# Patient Record
Sex: Male | Born: 1935 | Race: White | Hispanic: No | Smoking: Never smoker
Health system: Southern US, Community
[De-identification: ages and names within clinical notes are randomized; demographics above are authoritative.]

## PROBLEM LIST (undated history)

## (undated) DIAGNOSIS — F0390 Unspecified dementia without behavioral disturbance: Secondary | ICD-10-CM

## (undated) DIAGNOSIS — D638 Anemia in other chronic diseases classified elsewhere: Secondary | ICD-10-CM

## (undated) DIAGNOSIS — M8580 Other specified disorders of bone density and structure, unspecified site: Secondary | ICD-10-CM

## (undated) DIAGNOSIS — I441 Atrioventricular block, second degree: Secondary | ICD-10-CM

## (undated) DIAGNOSIS — I10 Essential (primary) hypertension: Secondary | ICD-10-CM

## (undated) DIAGNOSIS — E119 Type 2 diabetes mellitus without complications: Secondary | ICD-10-CM

## (undated) HISTORY — DX: Atrioventricular block, second degree: I44.1

## (undated) HISTORY — DX: Other specified disorders of bone density and structure, unspecified site: M85.80

## (undated) HISTORY — DX: Unspecified dementia without behavioral disturbance: F03.90

## (undated) HISTORY — DX: Essential (primary) hypertension: I10

## (undated) HISTORY — PX: PARTIAL GASTRECTOMY: SHX2172

## (undated) HISTORY — PX: INGUINAL HERNIA REPAIR: SUR1180

## (undated) HISTORY — DX: Anemia in other chronic diseases classified elsewhere: D63.8

## (undated) HISTORY — DX: Type 2 diabetes mellitus without complications: E11.9

---

## 1978-01-23 HISTORY — PX: OTHER SURGICAL HISTORY: SHX169

## 2009-10-15 ENCOUNTER — Encounter: Admission: RE | Admit: 2009-10-15 | Discharge: 2009-10-15 | Payer: Self-pay | Admitting: Internal Medicine

## 2010-10-24 HISTORY — PX: TRANSTHORACIC ECHOCARDIOGRAM: SHX275

## 2011-05-03 DIAGNOSIS — R5381 Other malaise: Secondary | ICD-10-CM | POA: Diagnosis not present

## 2011-05-03 DIAGNOSIS — D649 Anemia, unspecified: Secondary | ICD-10-CM | POA: Diagnosis not present

## 2011-05-03 DIAGNOSIS — F0390 Unspecified dementia without behavioral disturbance: Secondary | ICD-10-CM | POA: Diagnosis not present

## 2011-05-10 DIAGNOSIS — R5381 Other malaise: Secondary | ICD-10-CM | POA: Diagnosis not present

## 2011-05-10 DIAGNOSIS — R5383 Other fatigue: Secondary | ICD-10-CM | POA: Diagnosis not present

## 2011-05-10 DIAGNOSIS — F0390 Unspecified dementia without behavioral disturbance: Secondary | ICD-10-CM | POA: Diagnosis not present

## 2011-05-10 DIAGNOSIS — K922 Gastrointestinal hemorrhage, unspecified: Secondary | ICD-10-CM | POA: Diagnosis not present

## 2011-05-10 DIAGNOSIS — D649 Anemia, unspecified: Secondary | ICD-10-CM | POA: Diagnosis not present

## 2011-10-20 DIAGNOSIS — Z23 Encounter for immunization: Secondary | ICD-10-CM | POA: Diagnosis not present

## 2011-11-07 DIAGNOSIS — R5383 Other fatigue: Secondary | ICD-10-CM | POA: Diagnosis not present

## 2011-11-07 DIAGNOSIS — R5381 Other malaise: Secondary | ICD-10-CM | POA: Diagnosis not present

## 2011-11-07 DIAGNOSIS — K922 Gastrointestinal hemorrhage, unspecified: Secondary | ICD-10-CM | POA: Diagnosis not present

## 2011-11-07 DIAGNOSIS — F0390 Unspecified dementia without behavioral disturbance: Secondary | ICD-10-CM | POA: Diagnosis not present

## 2011-11-07 DIAGNOSIS — D649 Anemia, unspecified: Secondary | ICD-10-CM | POA: Diagnosis not present

## 2011-11-07 DIAGNOSIS — I2510 Atherosclerotic heart disease of native coronary artery without angina pectoris: Secondary | ICD-10-CM | POA: Diagnosis not present

## 2011-11-07 DIAGNOSIS — Z0000 Encounter for general adult medical examination without abnormal findings: Secondary | ICD-10-CM | POA: Diagnosis not present

## 2011-11-14 DIAGNOSIS — Z0000 Encounter for general adult medical examination without abnormal findings: Secondary | ICD-10-CM | POA: Diagnosis not present

## 2011-11-14 DIAGNOSIS — M810 Age-related osteoporosis without current pathological fracture: Secondary | ICD-10-CM | POA: Diagnosis not present

## 2011-11-14 DIAGNOSIS — Z23 Encounter for immunization: Secondary | ICD-10-CM | POA: Diagnosis not present

## 2011-11-14 DIAGNOSIS — M159 Polyosteoarthritis, unspecified: Secondary | ICD-10-CM | POA: Diagnosis not present

## 2011-11-14 DIAGNOSIS — R5381 Other malaise: Secondary | ICD-10-CM | POA: Diagnosis not present

## 2011-11-14 DIAGNOSIS — D638 Anemia in other chronic diseases classified elsewhere: Secondary | ICD-10-CM | POA: Diagnosis not present

## 2011-11-14 DIAGNOSIS — F0390 Unspecified dementia without behavioral disturbance: Secondary | ICD-10-CM | POA: Diagnosis not present

## 2012-05-21 DIAGNOSIS — M159 Polyosteoarthritis, unspecified: Secondary | ICD-10-CM | POA: Diagnosis not present

## 2012-05-21 DIAGNOSIS — D638 Anemia in other chronic diseases classified elsewhere: Secondary | ICD-10-CM | POA: Diagnosis not present

## 2012-05-21 DIAGNOSIS — I2510 Atherosclerotic heart disease of native coronary artery without angina pectoris: Secondary | ICD-10-CM | POA: Diagnosis not present

## 2012-05-21 DIAGNOSIS — R5383 Other fatigue: Secondary | ICD-10-CM | POA: Diagnosis not present

## 2012-05-21 DIAGNOSIS — R5381 Other malaise: Secondary | ICD-10-CM | POA: Diagnosis not present

## 2012-05-28 DIAGNOSIS — R5381 Other malaise: Secondary | ICD-10-CM | POA: Diagnosis not present

## 2012-05-28 DIAGNOSIS — M159 Polyosteoarthritis, unspecified: Secondary | ICD-10-CM | POA: Diagnosis not present

## 2012-05-28 DIAGNOSIS — M545 Low back pain, unspecified: Secondary | ICD-10-CM | POA: Diagnosis not present

## 2012-05-28 DIAGNOSIS — R5383 Other fatigue: Secondary | ICD-10-CM | POA: Diagnosis not present

## 2012-05-28 DIAGNOSIS — F0390 Unspecified dementia without behavioral disturbance: Secondary | ICD-10-CM | POA: Diagnosis not present

## 2012-10-01 DIAGNOSIS — Z23 Encounter for immunization: Secondary | ICD-10-CM | POA: Diagnosis not present

## 2012-11-23 DIAGNOSIS — I441 Atrioventricular block, second degree: Secondary | ICD-10-CM

## 2012-11-23 HISTORY — DX: Atrioventricular block, second degree: I44.1

## 2012-11-26 DIAGNOSIS — R5381 Other malaise: Secondary | ICD-10-CM | POA: Diagnosis not present

## 2012-11-26 DIAGNOSIS — E119 Type 2 diabetes mellitus without complications: Secondary | ICD-10-CM | POA: Diagnosis not present

## 2012-11-26 DIAGNOSIS — F0390 Unspecified dementia without behavioral disturbance: Secondary | ICD-10-CM | POA: Diagnosis not present

## 2012-11-26 DIAGNOSIS — M545 Low back pain, unspecified: Secondary | ICD-10-CM | POA: Diagnosis not present

## 2012-11-26 DIAGNOSIS — D649 Anemia, unspecified: Secondary | ICD-10-CM | POA: Diagnosis not present

## 2012-11-26 DIAGNOSIS — Z1331 Encounter for screening for depression: Secondary | ICD-10-CM | POA: Diagnosis not present

## 2012-11-26 DIAGNOSIS — M899 Disorder of bone, unspecified: Secondary | ICD-10-CM | POA: Diagnosis not present

## 2012-11-26 DIAGNOSIS — Z0000 Encounter for general adult medical examination without abnormal findings: Secondary | ICD-10-CM | POA: Diagnosis not present

## 2012-11-27 DIAGNOSIS — M545 Low back pain, unspecified: Secondary | ICD-10-CM | POA: Diagnosis not present

## 2012-11-27 DIAGNOSIS — F0390 Unspecified dementia without behavioral disturbance: Secondary | ICD-10-CM | POA: Diagnosis not present

## 2012-11-27 DIAGNOSIS — R5381 Other malaise: Secondary | ICD-10-CM | POA: Diagnosis not present

## 2012-11-27 DIAGNOSIS — M159 Polyosteoarthritis, unspecified: Secondary | ICD-10-CM | POA: Diagnosis not present

## 2012-12-03 DIAGNOSIS — M159 Polyosteoarthritis, unspecified: Secondary | ICD-10-CM | POA: Diagnosis not present

## 2012-12-03 DIAGNOSIS — I441 Atrioventricular block, second degree: Secondary | ICD-10-CM | POA: Diagnosis not present

## 2012-12-03 DIAGNOSIS — D638 Anemia in other chronic diseases classified elsewhere: Secondary | ICD-10-CM | POA: Diagnosis not present

## 2012-12-03 DIAGNOSIS — E119 Type 2 diabetes mellitus without complications: Secondary | ICD-10-CM | POA: Diagnosis not present

## 2012-12-03 DIAGNOSIS — F0390 Unspecified dementia without behavioral disturbance: Secondary | ICD-10-CM | POA: Diagnosis not present

## 2012-12-29 ENCOUNTER — Encounter: Payer: Self-pay | Admitting: Cardiology

## 2012-12-31 ENCOUNTER — Encounter: Payer: Self-pay | Admitting: Cardiology

## 2012-12-31 ENCOUNTER — Ambulatory Visit (INDEPENDENT_AMBULATORY_CARE_PROVIDER_SITE_OTHER): Payer: Medicare Other | Admitting: Cardiology

## 2012-12-31 VITALS — BP 152/76 | HR 58 | Ht 69.0 in | Wt 160.8 lb

## 2012-12-31 DIAGNOSIS — I10 Essential (primary) hypertension: Secondary | ICD-10-CM | POA: Diagnosis not present

## 2012-12-31 DIAGNOSIS — E119 Type 2 diabetes mellitus without complications: Secondary | ICD-10-CM | POA: Diagnosis not present

## 2012-12-31 DIAGNOSIS — R011 Cardiac murmur, unspecified: Secondary | ICD-10-CM

## 2012-12-31 DIAGNOSIS — F0390 Unspecified dementia without behavioral disturbance: Secondary | ICD-10-CM

## 2012-12-31 DIAGNOSIS — I441 Atrioventricular block, second degree: Secondary | ICD-10-CM

## 2012-12-31 NOTE — Patient Instructions (Signed)
Can not drive until after completion of wearing the monitor -and results are back.  Dr Herbie Baltimore would like you to wear a monitor for 1 month.Your physician has recommended that you wear an event monitor. Event monitors are medical devices that record the heart's electrical activity. Doctors most often [place] these monitors to diagnose arrhythmias. Arrhythmias are problems with the speed or rhythm of the heartbeat. The monitor is a small, portable device. You can wear one while you do your normal daily activities. This is usually used to diagnose what is causing palpitations/syncope (passing out).  Your physician has requested that you have an echocardiogram. Echocardiography is a painless test that uses sound waves to create images of your heart. It provides your doctor with information about the size and shape of your heart and how well your heart's chambers and valves are working. This procedure takes approximately one hour. There are no restrictions for this procedure.  Your physician wants you to follow-up in 6 weeks Dr Herbie Baltimore to follow up results echo, monitor.  You will receive a reminder letter in the mail two months in advance. If you don't receive a letter, please call our office to schedule the follow-up appointment.

## 2012-12-31 NOTE — Progress Notes (Signed)
PATIENT: Christian Daniels MRN: [id number] DOB: 1916/10/09 PCP: Georgianne Fick, MD  Clinic Note: Chief Complaint  Patient presents with  . New Evaluation    abn ekg -missed beats,no chest pain , no sob, no edema-in the past patient wore a monitor   HPI: Christian Daniels is a 79 y.o. male with a PMH below who presents today for before every meal won't on ECG however I do not have that ECG to review.  Interval History:  Marico is a very pleasant elderly gentleman who is the husband of one of my patients, Christian Daniels. He was recently diagnosed with diabetes to go along with his chronic anemia osteoarthritis and senile dementia. As part of routine evaluation he was found to have what looked to be Mobitz type II second-degree AV block on ECG. He does note having some mild palpitations, but denies any irregular sensation in his heart her chest. No lightheadedness or dizziness associated with anything more than some positional dizziness. He denies any syncope or near-syncope. TIA or amaurosis fugax symptoms. At no time has he noted any sensation of any chest tightness or pressure with rest or exertion he is mobile in a mandatory. Walking and actually driving still. He is mostly functional, but has forgetfulness. His wife essentially keeps him on the right track.  In the past, he had a fall and wore a monitor for while was told he had PVCs He was evaluated with echocardiogram back in October 2012 which showed essentially normal LV function borderline mitral valve prolapse with mild regurgitation. Otherwise essentially mild irregularities. He denies any melena, hematochezia or hematuria. No claudication symptoms.  Past Medical History  Diagnosis Date  . Diabetes mellitus type 2, controlled, without complications 01/02/2013  . Essential hypertension 01/02/2013  . Senile dementia 01/02/2013  . Second degree AV block, Mobitz type II  November 2014    Seen on office echo  . Anemia of chronic disease   .  Osteopenia     Prior Cardiac Evaluation and Past Surgical History: Past Surgical History  Procedure Laterality Date  . Partial prostatectomy  1980  . Partial gastrectomy   1950s  . Inguinal hernia repair  1960 and 65    X2  . Transthoracic echocardiogram  October 2012    Proximal septal thickening. EF greater than 55%. No regional wall motion abnormalities. Mild LA dilation, mild-moderate RA dilation. Mild MAC with mild posterior leaflet prolapse and mild to moderate MR.    No Known Allergies  Current Outpatient Prescriptions  Medication Sig Dispense Refill  . Calcium Carb-Cholecalciferol (CALCIUM 600 + D PO) Take by mouth daily.      . Cholecalciferol (D3-1000 PO) Take by mouth.      . donepezil (ARICEPT) 10 MG tablet Take 10 mg by mouth at bedtime.      . Folic Acid-Vit B6-Vit B12 (B COMPLEX-FOLIC ACID PO) Take by mouth daily.      . Multiple Vitamins-Minerals (MULTIVITAMIN WITH MINERALS) tablet Take 1 tablet by mouth daily.      . vitamin C (ASCORBIC ACID) 500 MG tablet Take 500 mg by mouth daily.      . vitamin E (VITAMIN E) 400 UNIT capsule Take 400 Units by mouth daily.       No current facility-administered medications for this visit.    History   Social History Narrative   He is a married father of 3, grandfather 4. He has been married to his wife Christian Daniels for 61 years. He lives with his  wife. He does help around the house.   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   He is a nonsmoker who does not drink alcohol.   He is quite active and does a moderate amount of walking.   Family History: family history includes Asthma in his son; Cancer (age of onset: 70) in his son; Cancer (age of onset: 33) in his mother; Dementia in his sister; Diabetes in his father; Hyperlipidemia in his brother; Stroke (age of onset: 11) in his father.  ROS: A comprehensive Review of Systems - Negative except Forgetfulness, otherwise negative  PHYSICAL EXAM BP  152/76  Pulse 58  Ht 5\' 9"  (1.753 m)  Wt 160 lb 12.8 oz (72.938 kg)  BMI 23.74 kg/m2 General appearance: alert and oriented to person place only, cooperative, appears stated age, no distress and well-nourished and well-groomed. Very well and/not forthcoming with data when asked questions. HEENT: [place]/AT, EOMI, MMM, anicteric sclera Neck: no adenopathy, no carotid bruit, no JVD, supple, symmetrical, trachea midline and thyroid not enlarged, symmetric, no tenderness/mass/nodules Lungs: clear to auscultation bilaterally and normal percussion bilaterally Heart: normal apical impulse, regular rate and rhythm, S1, S2 normal, no S3 or S4 and 1-2/6 HSM at apex with maybe 1/6 SEM at the base. Interestingly he had episodes where his heart rate would Speed up and slow down intermittently during the exam. Abdomen: soft, non-tender; bowel sounds normal; no masses,  no organomegaly Extremities: extremities normal, atraumatic, no cyanosis or edema and no ulcers, gangrene or trophic changes Pulses: 2+ and symmetric Neurologic: Mental status: Alert, oriented, thought content appropriate, alertness: alert, orientation: person, place, city, affect: normal Cranial nerves: Grossly normal  AVW:[id number] today: Yes Rate: 58 , Rhythm: Sinus bradycardia with first-degree AV block  Recent Labs: Not available  ASSESSMENT / PLAN: Very pleasant elderly gentleman with mild dementia who was found to have possible Mobitz type II heart block on ECG. He is not as yet noticed any symptoms.  Second degree atrioventricular block Unfortunately, I do not have the ECGs to review. No reason to doubt the veracity of the finding. This is somewhat concerning as complete heart block as a next step. He does have prolonged PR interval but did not see any prolongation of the PR interval. He is not on any AV nodal agents. I will need to evaluate his rhythm over the course of time) term whether or not he want consideration for pacemaker. I  will have him wear a CardioNet event monitor. I did talk to his wife and him about this concerning finding and there is significant risk for possible syncope associated with it. With that in mind I had to instruct him that I am not able to authorize him to drive his car would this diagnosis until it has been confirmed or denied.  Essential hypertension His blood pressure is borderline elevated, however with his 82 and concern for possible bradycardia related near-syncope, my information will be to allow for mild permissive hypertension the range of 130 to 150 mmHg systolic. I would not make adjustments to his regimen.  Murmur I do not think the murmur that hearing is of any significance, however there could be and I believe is what may spark the more aggressive evaluation of the arrhythmia. I also like to evaluate the extent of his prolapse and mitral regurgitation seen on previous echo. There was also the systolic murmur that would suggest aortic sclerosis. Plan: 2-D echocardiogram.    Orders Placed This  Encounter  Procedures  . EKG 12-Lead  . Cardiac event monitor    Second degree  AV block    Standing Status: Future     Number of Occurrences: 1     Standing Expiration Date: 12/31/2013    Order Specific Question:  Where should this test be performed    Answer:  [facility]  . 2D Echocardiogram without contrast    Standing Status: Future     Number of Occurrences:      Standing Expiration Date: 12/31/2013    Order Specific Question:  Type of Echo    Answer:  Complete    Order Specific Question:  Where should this test be performed    Answer:  [facility]    Order Specific Question:  Reason for exam-Echo    Answer:  Murmur  785.2    Followup: 6 weeks to discuss the results of the monitor and echocardiogram.    Madilyne Tadlock W. Herbie Baltimore, M.D., M.S. [facility] [address] [place], [place]  [address]  [phone] Pager #  [phone]

## 2013-01-02 ENCOUNTER — Encounter: Payer: Self-pay | Admitting: Cardiology

## 2013-01-02 DIAGNOSIS — I10 Essential (primary) hypertension: Secondary | ICD-10-CM

## 2013-01-02 DIAGNOSIS — R011 Cardiac murmur, unspecified: Secondary | ICD-10-CM | POA: Insufficient documentation

## 2013-01-02 DIAGNOSIS — F0390 Unspecified dementia without behavioral disturbance: Secondary | ICD-10-CM

## 2013-01-02 DIAGNOSIS — E119 Type 2 diabetes mellitus without complications: Secondary | ICD-10-CM

## 2013-01-02 DIAGNOSIS — I441 Atrioventricular block, second degree: Secondary | ICD-10-CM | POA: Insufficient documentation

## 2013-01-02 HISTORY — DX: Unspecified dementia, unspecified severity, without behavioral disturbance, psychotic disturbance, mood disturbance, and anxiety: F03.90

## 2013-01-02 HISTORY — DX: Type 2 diabetes mellitus without complications: E11.9

## 2013-01-02 HISTORY — DX: Essential (primary) hypertension: I10

## 2013-01-02 NOTE — Assessment & Plan Note (Addendum)
I do not think the murmur that hearing is of any significance, however there could be and I believe is what may spark the more aggressive evaluation of the arrhythmia. I also like to evaluate the extent of his prolapse and mitral regurgitation seen on previous echo. There was also the systolic murmur that would suggest aortic sclerosis. Plan: 2-D echocardiogram.

## 2013-01-02 NOTE — Assessment & Plan Note (Signed)
His blood pressure is borderline elevated, however with his 82 and concern for possible bradycardia related near-syncope, my information will be to allow for mild permissive hypertension the range of 130 to 150 mmHg systolic. I would not make adjustments to his regimen.

## 2013-01-02 NOTE — Assessment & Plan Note (Addendum)
Unfortunately, I do not have the ECGs to review. No reason to doubt the veracity of the finding. This is somewhat concerning as complete heart block as a next step. Christian Daniels does have prolonged PR interval but did not see any prolongation of the PR interval. Christian Daniels is not on any AV nodal agents. I will need to evaluate his rhythm over the course of time) term whether or not Christian Daniels want consideration for pacemaker. I will have him wear a CardioNet event monitor. I did talk to his wife and him about this concerning finding and there is significant risk for possible syncope associated with it. With that in mind I had to instruct him that I am not able to authorize him to drive his car would this diagnosis until it has been confirmed or denied.

## 2013-01-21 ENCOUNTER — Ambulatory Visit (HOSPITAL_COMMUNITY)
Admission: RE | Admit: 2013-01-21 | Discharge: 2013-01-21 | Disposition: A | Discharge status: Home / Self Care | Payer: Medicare Other | Source: Ambulatory Visit | Attending: Cardiovascular Disease | Admitting: Cardiovascular Disease

## 2013-01-21 DIAGNOSIS — I059 Rheumatic mitral valve disease, unspecified: Secondary | ICD-10-CM | POA: Diagnosis not present

## 2013-01-21 DIAGNOSIS — I10 Essential (primary) hypertension: Secondary | ICD-10-CM | POA: Diagnosis not present

## 2013-01-21 DIAGNOSIS — R011 Cardiac murmur, unspecified: Secondary | ICD-10-CM | POA: Diagnosis not present

## 2013-01-21 DIAGNOSIS — I441 Atrioventricular block, second degree: Secondary | ICD-10-CM | POA: Diagnosis not present

## 2013-01-21 DIAGNOSIS — E119 Type 2 diabetes mellitus without complications: Secondary | ICD-10-CM | POA: Diagnosis not present

## 2013-01-21 DIAGNOSIS — I079 Rheumatic tricuspid valve disease, unspecified: Secondary | ICD-10-CM | POA: Diagnosis not present

## 2013-01-21 HISTORY — PX: TRANSTHORACIC ECHOCARDIOGRAM: SHX275

## 2013-01-21 NOTE — Progress Notes (Signed)
2D Echo Performed 01/21/2013    Christian Daniels, RCS  

## 2013-02-03 ENCOUNTER — Ambulatory Visit (INDEPENDENT_AMBULATORY_CARE_PROVIDER_SITE_OTHER): Payer: Medicare Other | Admitting: Cardiology

## 2013-02-03 ENCOUNTER — Encounter: Payer: Self-pay | Admitting: Cardiology

## 2013-02-03 VITALS — BP 110/70 | HR 60 | Ht 67.5 in | Wt 156.9 lb

## 2013-02-03 DIAGNOSIS — I10 Essential (primary) hypertension: Secondary | ICD-10-CM

## 2013-02-03 DIAGNOSIS — I441 Atrioventricular block, second degree: Secondary | ICD-10-CM

## 2013-02-03 DIAGNOSIS — I471 Supraventricular tachycardia, unspecified: Secondary | ICD-10-CM

## 2013-02-03 DIAGNOSIS — F0390 Unspecified dementia without behavioral disturbance: Secondary | ICD-10-CM | POA: Diagnosis not present

## 2013-02-03 DIAGNOSIS — R011 Cardiac murmur, unspecified: Secondary | ICD-10-CM

## 2013-02-03 NOTE — Progress Notes (Signed)
PATIENT: Christian Daniels MRN: [id number] DOB: Mar 25, 1916 PCP: Georgianne Fick, MD  Clinic Note: Chief Complaint  Patient presents with  . Follow-up    RESULTS ECHO AND MONITOR, NO CHEST PAIN ,NO SOB, MO EDEMA   HPI: Christian Daniels is a 80 y.o. male with a PMH below who presents today for followup after having an echocardiogram and an event monitor. This is done to evaluate secondary AV block on baseline ECG.  An echocardiogram that was essentially normal with mild MR and mild TR but normal EF. No wall motion abnormalities or valvular lesions.  He also had an event monitor that showed intermittent sinus bradycardia with first-degree AV block. PVCs, blocked PACs or PVCs that would lead to pauses as well as simple pauses of 2-3 seconds. He then had multiple beats of PSVT or PAT the 110s to 120s and 30s. An episode of it apparently conducted SVT or a true VT episode. The comprehensive review this monitor will take quite a long time.  Significantly, after our talk of possibility of needing pacemaker during last visit, he had a long talk together about his desires, it is quite clear that he would not want a pacemaker, regardless of the findings. He simply just wants to continue on with his life as is with no significant events or procedures.) Have further concept, and again there agreed that he was not going to get his driver's license renewed, and we'll no longer drive. Lastly, the technologist the presence of bleeding or ulcer both of them, and his desire for natural death which after discussion with sounds consistent with a DO NOT RESUSCITATE decision as far as CPR or intubation.  Interval History:  [name] is a very pleasant elderly gentleman who is the husband of one of my patients, Christian Daniels. He was recently diagnosed with diabetes to go along with his chronic anemia osteoarthritis and senile dementia. As part of routine evaluation he was found to have what looked to be Mobitz type II second-degree AV  block on ECG. He only notes mild intermittent  palpitations, but denies any irregular sensation in his heart her chest. No lightheadedness or dizziness associated with anything more than some positional dizziness. He denies any syncope or near-syncope. TIA or amaurosis fugax symptoms. Actually when you ask him questions, most the time his wife will ask the question, but he basically just smiles and says he feels fine.    At no time has he noted any sensation of any chest tightness or pressure with rest or exertion he is mobile in a mandatory. Walking and actually driving still. He is mostly functional, but has forgetfulness. His wife essentially keeps him on the right track.  In the past, he had a fall and wore a monitor for while was told he had PVCs He was evaluated with echocardiogram back in October 2012 which showed essentially normal LV function borderline mitral valve prolapse with mild regurgitation. Otherwise essentially mild irregularities. He denies any melena, hematochezia or hematuria. No claudication symptoms.  Past Medical History  Diagnosis Date  . Diabetes mellitus type 2, controlled, without complications 01/02/2013  . Essential hypertension 01/02/2013  . Senile dementia 01/02/2013  . Second degree AV block, Mobitz type II  November 2014    Seen on office ECG; -- monitor shows intermittent second-degree block which appears to potentially be type II in some places but in other places appear to be wenkebach. Bradycardia with first-degree block and PVCs. Occasionally PVCs or block. Multiple runs of PSVT  and one episode of VT. All symptoms were asymptomatic  . Anemia of chronic disease   . Osteopenia     Prior Cardiac Evaluation and Past Surgical History: Past Surgical History  Procedure Laterality Date  . Partial prostatectomy  1980  . Partial gastrectomy   1950s  . Inguinal hernia repair  1960 and 60    X2  . Transthoracic echocardiogram  October 2012    Proximal septal  thickening. EF greater than 55%. No regional wall motion abnormalities. Mild LA dilation, mild-moderate RA dilation. Mild MAC with mild posterior leaflet prolapse and mild to moderate MR.  . Transthoracic echocardiogram  01/21/2013    Normal EF with hyperdynamic contraction EF 65-70 %. Mild MR and mild TR. No significant wall motion abnormalities or valvular lesions.    No Known Allergies  Current Outpatient Prescriptions  Medication Sig Dispense Refill  . Calcium Carb-Cholecalciferol (CALCIUM 600 + D PO) Take by mouth daily.      . Cholecalciferol (D3-1000 PO) Take by mouth.      . donepezil (ARICEPT) 10 MG tablet Take 10 mg by mouth at bedtime.      . Folic Acid-Vit N2-TFT D32 (B COMPLEX-FOLIC ACID PO) Take by mouth daily.      . Multiple Vitamins-Minerals (MULTIVITAMIN WITH MINERALS) tablet Take 1 tablet by mouth daily.      . vitamin C (ASCORBIC ACID) 500 MG tablet Take 500 mg by mouth daily as needed.       . vitamin E (VITAMIN E) 400 UNIT capsule Take 400 Units by mouth daily.       No current facility-administered medications for this visit.    History   Social History Narrative   He is a married father of 50, grandfather 46. He has been married to his wife Christian Daniels for 61 years. He lives with his wife. He does help around the house.   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   He is a nonsmoker who does not drink alcohol.   He is quite active and does a moderate amount of walking.   The patient has a living will and desire for natural death.  Discussed in detail the concept of DO NOT RESUSCITATE. Patient and wife both agree that this is a good idea and will discuss with PCP.   Family History: family history includes Asthma in his son; Cancer (age of onset: 31) in his son; Cancer (age of onset: 32) in his mother; Dementia in his sister; Diabetes in his father; Hyperlipidemia in his brother; Stroke (age of onset: 97) in his father.  ROS: A  comprehensive Review of Systems - Negative except Forgetfulness, otherwise negative  PHYSICAL EXAM BP 110/70  Pulse 60  Ht 5' 7.5" (1.715 m)  Wt 156 lb 14.4 oz (71.169 kg)  BMI 24.20 kg/m2 General appearance: alert and oriented to person place only, cooperative, appears stated age, no distress and well-nourished and well-groomed. Very well and/not forthcoming with data when asked questions. HEENT: [place]/AT, EOMI, MMM, anicteric sclera Neck: no adenopathy, no carotid bruit, no JVD, supple, symmetrical, trachea midline and thyroid not enlarged, symmetric, no tenderness/mass/nodules Lungs: clear to auscultation bilaterally and normal percussion bilaterally Heart: normal apical impulse, regular rate and rhythm, S1, S2 normal, no S3 or S4 and 1-2/6 HSM at apex with maybe 1/6 SEM at the base. Interestingly he had episodes where his heart rate would Speed up and slow down intermittently during the exam. Abdomen: soft, non-tender; bowel  sounds normal; no masses,  no organomegaly Extremities: extremities normal, atraumatic, no cyanosis or edema and no ulcers, gangrene or trophic changes Pulses: 2+ and symmetric Neurologic: Mental status: Alert, oriented, thought content appropriate, alertness: alert, orientation: person, place, city, affect: normal Cranial nerves: Grossly normal  PPJ:[id number] today: Yes Rate: 58 , Rhythm: Sinus bradycardia with first-degree AV block  Recent Labs: Not available  ASSESSMENT / PLAN: Very pleasant elderly gentleman with mild dementia who was found to have possible Mobitz type II heart block on ECG. He is not as yet noticed any symptoms. I did have a long talk with the patient and his wife about the decision to not proceed with pacemaker and potential symptoms that could occur. They're concerned about possible syncope while driving and therefore not to drive. Will not have his driver's license renewed.  Second degree atrioventricular block There does indeed appear to be  some Mobitz Type II with some of the dropped beats on the monitor strips, but unfortunately the strips did not allow enough complexes to the PR interval.  Also noted was frequent PSVT, one run of VT. Multiple pauses that could potentially be blocked PACs, versus Second-Degree AV Block.  Overall, he is not overly symptomatic, and therefore with minimal block episodes with no pauses greater than 3 seconds, and not sure he would meet criteria for pacemaker regardless of whether he would desire 1. After long discussion they have decided that he would not want to go forward with the pacemaker, especially with no symptoms, but really just wants to go about his life with no further evaluation.  PSVT (paroxysmal supraventricular tachycardia) - short bursts that are asymptomatic Interesting that he has short bursts of tachycardia arrhythmias in the setting of sinus bradycardia with first-degree AV block and second-degree block. Some impression of tachybradycardia syndrome, although that is usually with atrial fibrillation. Again he is not interested in pacemaker, and with the bradycardia spells with pauses, I would not use beta blocker or other AV nodal agent to treat PSVT. As he is not symptomatic, I think the best plan would be to monitor. I will have and agree with the decision to not continue driving as I am fearful that one of these episodes could lead to syncope.   Essential hypertension Well-controlled today. Would not adjust medications.  Senile dementia He does seem to be relatively poor historian, and I am in agreement with him likely having dementia. With all this in consideration, the decision not to go forward with pacemaker placement is probably a reasonable decision to make. They seem to be relatively comfortable with the symptoms and potential outcomes of either bradycardia or tachyarrhythmia. Repeatedly his regimen the comment that "he just wants to go home and live his life regardless of what  happens ".  Murmur Mild murmur with no significant lesions noted on echo.    Orders Placed This Encounter  Procedures  . Monitor    This order was created through External Result Entry    Followup: He'll return to the care of his primary provider. Based on discussion with the patient and his wife, the decision is to not proceed any further treatment of his arrhythmias. They have decided to pursue DO NOT RESUSCITATE order after a detailed discussion. Greater than 45 minutes was spent and calcium along as well as explaining the results of the tests.   Kele Barthelemy W. Ellyn Hack, M.D., M.S. [facility] [address] [place], [place]  [address]  [phone] Pager #  [phone]  

## 2013-02-03 NOTE — Patient Instructions (Signed)
AGREE PATIENT SHOULD NOT DRIVE.  Have a discussion with your primary about DNR.  Your physician recommends that you schedule a follow-up appointment AS NEEDED. You will follow up with your primary.

## 2013-02-05 ENCOUNTER — Encounter: Payer: Self-pay | Admitting: Cardiology

## 2013-02-05 DIAGNOSIS — I471 Supraventricular tachycardia: Secondary | ICD-10-CM | POA: Insufficient documentation

## 2013-02-05 NOTE — Assessment & Plan Note (Signed)
There does indeed appear to be some Mobitz Type II with some of the dropped beats on the monitor strips, but unfortunately the strips did not allow enough complexes to the PR interval.  Also noted was frequent PSVT, one run of VT. Multiple pauses that could potentially be blocked PACs, versus Second-Degree AV Block.  Overall, he is not overly symptomatic, and therefore with minimal block episodes with no pauses greater than 3 seconds, and not sure he would meet criteria for pacemaker regardless of whether he would desire 1. After long discussion they have decided that he would not want to go forward with the pacemaker, especially with no symptoms, but really just wants to go about his life with no further evaluation.

## 2013-02-05 NOTE — Assessment & Plan Note (Signed)
Interesting that he has short bursts of tachycardia arrhythmias in the setting of sinus bradycardia with first-degree AV block and second-degree block. Some impression of tachybradycardia syndrome, although that is usually with atrial fibrillation. Again he is not interested in pacemaker, and with the bradycardia spells with pauses, I would not use beta blocker or other AV nodal agent to treat PSVT. As he is not symptomatic, I think the best plan would be to monitor. I will have and agree with the decision to not continue driving as I am fearful that one of these episodes could lead to syncope.

## 2013-02-05 NOTE — Assessment & Plan Note (Signed)
He does seem to be relatively poor historian, and I am in agreement with him likely having dementia. With all this in consideration, the decision not to go forward with pacemaker placement is probably a reasonable decision to make. They seem to be relatively comfortable with the symptoms and potential outcomes of either bradycardia or tachyarrhythmia. Repeatedly his regimen the comment that "he just wants to go home and live his life regardless of what happens ".

## 2013-02-05 NOTE — Assessment & Plan Note (Signed)
Mild murmur with no significant lesions noted on echo.

## 2013-02-05 NOTE — Assessment & Plan Note (Signed)
Well-controlled today. Would not adjust medications.

## 2013-02-18 ENCOUNTER — Telehealth: Payer: Self-pay | Admitting: Cardiology

## 2013-02-18 NOTE — Telephone Encounter (Signed)
He should be fine with short driving on back roads. Leonie Man, MD

## 2013-02-18 NOTE — Telephone Encounter (Signed)
Returned call and informed wife per instructions by MD.  Verbalized understanding and agreed w/ plan.  Stated pt is not having any problems and is doing well.  Wife thanked Dr. Ellyn Hack "from the bottom of my heart."

## 2013-02-18 NOTE — Telephone Encounter (Signed)
Returned call.  No answer/voicemail.  Will try later.  IF PT CALLS BACK, PLEASE FIND OUT WHAT HE NEEDS PERMISSION FOR SO THIS CAN BE ADDRESSED BEFORE CALL IS RETURNED.

## 2013-02-18 NOTE — Telephone Encounter (Signed)
Has a question? Would like to get  Dr.Harding permission on something .Marland Kitchen Please Call   Thanks

## 2013-02-18 NOTE — Telephone Encounter (Signed)
Returned call and pt verified x 2 w/ Dyann Ruddle, pt's wife.  Stated they wanted to know if pt can drive to [employer] or [place], which are about 10 mins from home.  Stated pt is bothered more having to wait for someone to take him than if he drives himself.  Stated they travel when the streets are quiet and clear and wanted to know if pt can do this.  Informed Dr. Ellyn Hack will be notified.  Message forwarded to Dr. Sharlyn Bologna, RN.

## 2013-03-01 NOTE — Progress Notes (Signed)
Quick Note:  Final Report:   Avg HR 65bpm; Peak HR 153 bpm; Low HR 31 bpm  Mostly sinus rhythm with a first-degree AV block. Occasional sinus bradycardia with first-degree AV block. Associated with PACs, PVCs (occasional blocked PACs) Occasional wenkebach block. With the longest pause being 2.8 seconds Frequent PACs/PVCs with frequent atrial runs/PSVT (rate as high as 153 beats per minute) no longer than 34 seconds. PVCs and couplets, with 1 episode of nonsustained VT of roughly 10-18 beats.  All asymptomatic.  Given that no symptoms, would not recommend beta blocker or any a middle aged for brief runs of PSVT, unless prolonged and symptomatic. The presence of first degree AV block with intermittent wenkebach block and pauses would be risky. We discussed driving limitations and expectant management during clinic visit.  Leonie Man, MD  ______

## 2013-04-08 DIAGNOSIS — E119 Type 2 diabetes mellitus without complications: Secondary | ICD-10-CM | POA: Diagnosis not present

## 2013-04-15 DIAGNOSIS — M159 Polyosteoarthritis, unspecified: Secondary | ICD-10-CM | POA: Diagnosis not present

## 2013-04-15 DIAGNOSIS — I441 Atrioventricular block, second degree: Secondary | ICD-10-CM | POA: Diagnosis not present

## 2013-04-15 DIAGNOSIS — E119 Type 2 diabetes mellitus without complications: Secondary | ICD-10-CM | POA: Diagnosis not present

## 2013-04-15 DIAGNOSIS — F0390 Unspecified dementia without behavioral disturbance: Secondary | ICD-10-CM | POA: Diagnosis not present

## 2013-08-26 DIAGNOSIS — L821 Other seborrheic keratosis: Secondary | ICD-10-CM | POA: Diagnosis not present

## 2013-08-26 DIAGNOSIS — D2330 Other benign neoplasm of skin of unspecified part of face: Secondary | ICD-10-CM | POA: Diagnosis not present

## 2013-08-26 DIAGNOSIS — D485 Neoplasm of uncertain behavior of skin: Secondary | ICD-10-CM | POA: Diagnosis not present

## 2013-08-26 DIAGNOSIS — C44319 Basal cell carcinoma of skin of other parts of face: Secondary | ICD-10-CM | POA: Diagnosis not present

## 2013-08-26 DIAGNOSIS — D1801 Hemangioma of skin and subcutaneous tissue: Secondary | ICD-10-CM | POA: Diagnosis not present

## 2013-09-17 DIAGNOSIS — C44319 Basal cell carcinoma of skin of other parts of face: Secondary | ICD-10-CM | POA: Diagnosis not present

## 2013-09-23 DIAGNOSIS — Z23 Encounter for immunization: Secondary | ICD-10-CM | POA: Diagnosis not present

## 2013-10-28 DIAGNOSIS — E119 Type 2 diabetes mellitus without complications: Secondary | ICD-10-CM | POA: Diagnosis not present

## 2013-11-04 DIAGNOSIS — M159 Polyosteoarthritis, unspecified: Secondary | ICD-10-CM | POA: Diagnosis not present

## 2013-11-04 DIAGNOSIS — I441 Atrioventricular block, second degree: Secondary | ICD-10-CM | POA: Diagnosis not present

## 2013-11-04 DIAGNOSIS — E119 Type 2 diabetes mellitus without complications: Secondary | ICD-10-CM | POA: Diagnosis not present

## 2013-11-04 DIAGNOSIS — F0390 Unspecified dementia without behavioral disturbance: Secondary | ICD-10-CM | POA: Diagnosis not present

## 2014-04-23 DIAGNOSIS — M159 Polyosteoarthritis, unspecified: Secondary | ICD-10-CM | POA: Diagnosis not present

## 2014-04-23 DIAGNOSIS — F0390 Unspecified dementia without behavioral disturbance: Secondary | ICD-10-CM | POA: Diagnosis not present

## 2014-04-23 DIAGNOSIS — E119 Type 2 diabetes mellitus without complications: Secondary | ICD-10-CM | POA: Diagnosis not present

## 2014-04-23 DIAGNOSIS — Z0000 Encounter for general adult medical examination without abnormal findings: Secondary | ICD-10-CM | POA: Diagnosis not present

## 2014-04-23 DIAGNOSIS — I441 Atrioventricular block, second degree: Secondary | ICD-10-CM | POA: Diagnosis not present

## 2014-04-23 DIAGNOSIS — Z1389 Encounter for screening for other disorder: Secondary | ICD-10-CM | POA: Diagnosis not present

## 2014-04-23 DIAGNOSIS — N390 Urinary tract infection, site not specified: Secondary | ICD-10-CM | POA: Diagnosis not present

## 2014-04-30 DIAGNOSIS — E119 Type 2 diabetes mellitus without complications: Secondary | ICD-10-CM | POA: Diagnosis not present

## 2014-04-30 DIAGNOSIS — M545 Low back pain: Secondary | ICD-10-CM | POA: Diagnosis not present

## 2014-04-30 DIAGNOSIS — M159 Polyosteoarthritis, unspecified: Secondary | ICD-10-CM | POA: Diagnosis not present

## 2014-04-30 DIAGNOSIS — F0390 Unspecified dementia without behavioral disturbance: Secondary | ICD-10-CM | POA: Diagnosis not present

## 2014-10-29 DIAGNOSIS — E119 Type 2 diabetes mellitus without complications: Secondary | ICD-10-CM | POA: Diagnosis not present

## 2014-11-05 DIAGNOSIS — M159 Polyosteoarthritis, unspecified: Secondary | ICD-10-CM | POA: Diagnosis not present

## 2014-11-05 DIAGNOSIS — E119 Type 2 diabetes mellitus without complications: Secondary | ICD-10-CM | POA: Diagnosis not present

## 2014-11-05 DIAGNOSIS — I441 Atrioventricular block, second degree: Secondary | ICD-10-CM | POA: Diagnosis not present

## 2014-11-05 DIAGNOSIS — Z23 Encounter for immunization: Secondary | ICD-10-CM | POA: Diagnosis not present

## 2014-11-05 DIAGNOSIS — F0390 Unspecified dementia without behavioral disturbance: Secondary | ICD-10-CM | POA: Diagnosis not present

## 2015-05-13 DIAGNOSIS — F0390 Unspecified dementia without behavioral disturbance: Secondary | ICD-10-CM | POA: Diagnosis not present

## 2015-05-13 DIAGNOSIS — M159 Polyosteoarthritis, unspecified: Secondary | ICD-10-CM | POA: Diagnosis not present

## 2015-05-13 DIAGNOSIS — N390 Urinary tract infection, site not specified: Secondary | ICD-10-CM | POA: Diagnosis not present

## 2015-05-13 DIAGNOSIS — Z0000 Encounter for general adult medical examination without abnormal findings: Secondary | ICD-10-CM | POA: Diagnosis not present

## 2015-05-13 DIAGNOSIS — I441 Atrioventricular block, second degree: Secondary | ICD-10-CM | POA: Diagnosis not present

## 2015-05-13 DIAGNOSIS — E119 Type 2 diabetes mellitus without complications: Secondary | ICD-10-CM | POA: Diagnosis not present

## 2015-06-03 DIAGNOSIS — I441 Atrioventricular block, second degree: Secondary | ICD-10-CM | POA: Diagnosis not present

## 2015-06-03 DIAGNOSIS — E119 Type 2 diabetes mellitus without complications: Secondary | ICD-10-CM | POA: Diagnosis not present

## 2015-06-03 DIAGNOSIS — F0390 Unspecified dementia without behavioral disturbance: Secondary | ICD-10-CM | POA: Diagnosis not present

## 2015-06-03 DIAGNOSIS — M159 Polyosteoarthritis, unspecified: Secondary | ICD-10-CM | POA: Diagnosis not present

## 2015-07-15 ENCOUNTER — Ambulatory Visit: Payer: Medicare Other | Admitting: Podiatry

## 2015-07-22 ENCOUNTER — Encounter: Payer: Self-pay | Admitting: Podiatry

## 2015-07-22 ENCOUNTER — Ambulatory Visit (INDEPENDENT_AMBULATORY_CARE_PROVIDER_SITE_OTHER): Payer: Medicare Other | Admitting: Podiatry

## 2015-07-22 VITALS — BP 185/92 | HR 59 | Resp 14

## 2015-07-22 DIAGNOSIS — B351 Tinea unguium: Secondary | ICD-10-CM | POA: Diagnosis not present

## 2015-07-22 DIAGNOSIS — M79676 Pain in unspecified toe(s): Secondary | ICD-10-CM | POA: Diagnosis not present

## 2015-07-22 NOTE — Progress Notes (Signed)
   Subjective:    Patient ID: ORMOND SCHERER, male    DOB: 08-12-1916, 82 y.o.   MRN: [id number]  HPI this patient presents to the office with chief complaint of long thick painful nails on both feet. He states the nails have become painful and tender walking and wearing his shoes. He is unable to self treat. This patient has Alzheimer's. This patient presents the office for preventative foot care services. This patient is diabetic.      Review of Systems  All other systems reviewed and are negative.      Objective:   Physical Exam GENERAL APPEARANCE: Alert, conversant. Appropriately groomed. No acute distress.  VASCULAR: Pedal pulses are not  palpable at  [facility] and PT bilateral.  Capillary refill time is immediate to all digits,  Normal temperature gradient.  Digital hair growth is present bilateral  NEUROLOGIC: sensation is normal to 5.07 monofilament at 5/5 sites bilateral.  Light touch is intact bilateral, Muscle strength normal.  MUSCULOSKELETAL: acceptable muscle strength, tone and stability bilateral.  Intrinsic muscluature intact bilateral.  Rectus appearance of foot and digits noted bilateral.   DERMATOLOGIC: skin color, texture, and turgor are within normal limits.  No preulcerative lesions or ulcers  are seen, no interdigital maceration noted.  No open lesions present.  . No drainage noted.  Thick disfigured discolored nails 1-3 B/L         Assessment & Plan:  Onychomycosis  Diabetes with angiopathy.  IE  Debridement and grinding of long thick nails.  RTC 3 months   Gardiner Barefoot DPM

## 2015-07-25 ENCOUNTER — Emergency Department (HOSPITAL_COMMUNITY): Payer: Medicare Other

## 2015-07-25 ENCOUNTER — Encounter (HOSPITAL_COMMUNITY): Payer: Self-pay | Admitting: *Deleted

## 2015-07-25 ENCOUNTER — Emergency Department (HOSPITAL_COMMUNITY)
Admission: EM | Admit: 2015-07-25 | Discharge: 2015-07-25 | Disposition: A | Discharge status: Home / Self Care | Payer: Medicare Other | Attending: Emergency Medicine | Admitting: Emergency Medicine

## 2015-07-25 DIAGNOSIS — I10 Essential (primary) hypertension: Secondary | ICD-10-CM | POA: Insufficient documentation

## 2015-07-25 DIAGNOSIS — S199XXA Unspecified injury of neck, initial encounter: Secondary | ICD-10-CM | POA: Diagnosis not present

## 2015-07-25 DIAGNOSIS — W1839XA Other fall on same level, initial encounter: Secondary | ICD-10-CM | POA: Insufficient documentation

## 2015-07-25 DIAGNOSIS — S0990XA Unspecified injury of head, initial encounter: Secondary | ICD-10-CM

## 2015-07-25 DIAGNOSIS — Y999 Unspecified external cause status: Secondary | ICD-10-CM | POA: Insufficient documentation

## 2015-07-25 DIAGNOSIS — R220 Localized swelling, mass and lump, head: Secondary | ICD-10-CM | POA: Diagnosis not present

## 2015-07-25 DIAGNOSIS — E119 Type 2 diabetes mellitus without complications: Secondary | ICD-10-CM | POA: Diagnosis not present

## 2015-07-25 DIAGNOSIS — Y9389 Activity, other specified: Secondary | ICD-10-CM | POA: Insufficient documentation

## 2015-07-25 DIAGNOSIS — S0101XA Laceration without foreign body of scalp, initial encounter: Secondary | ICD-10-CM | POA: Insufficient documentation

## 2015-07-25 DIAGNOSIS — Z79899 Other long term (current) drug therapy: Secondary | ICD-10-CM | POA: Insufficient documentation

## 2015-07-25 DIAGNOSIS — Y929 Unspecified place or not applicable: Secondary | ICD-10-CM | POA: Diagnosis not present

## 2015-07-25 MED ORDER — TETANUS-DIPHTH-ACELL PERTUSSIS 5-2.5-18.5 LF-MCG/0.5 IM SUSP
0.5000 mL | Freq: Once | INTRAMUSCULAR | Status: AC
  Administered 2015-07-25: 0.5 mL via INTRAMUSCULAR
  Filled 2015-07-25: qty 0.5

## 2015-07-25 MED ORDER — LIDOCAINE-EPINEPHRINE (PF) 1 %-1:200000 IJ SOLN
20.0000 mL | Freq: Once | INTRAMUSCULAR | Status: AC
  Administered 2015-07-25: 20 mL
  Filled 2015-07-25: qty 30

## 2015-07-25 MED ORDER — LIDOCAINE-EPINEPHRINE-TETRACAINE (LET) SOLUTION
3.0000 mL | Freq: Once | NASAL | Status: AC
  Administered 2015-07-25: 3 mL via TOPICAL
  Filled 2015-07-25: qty 3

## 2015-07-25 NOTE — ED Notes (Signed)
Lidocaine at bedside.

## 2015-07-25 NOTE — ED Notes (Signed)
Patient was moving a garbage can to the curb when he lost his balance and fell straight back.  Fall was witnessed by his wife who called a neighbor.  Neighbor took him to an UC and they declined to treat him.  Patient has an @ 2 cm laceration to the back right head.  Bleeding is controlled.  Patient is reportedly not on blood thinners.

## 2015-07-25 NOTE — Discharge Instructions (Signed)
You will need to have your staple removed in 7-10 days. Watch for signs of infection, including fever, worsening redness/pain/swelling, pus drainage.  Return for worsening symptoms, including concern for infection, confusion, vomiting and unable to keep down food/fluids, difficulty walking or any other symptoms concerning to you.  Keep dry for first day. You can then wash with warm soapy water as needed.   Laceration Care, Adult A laceration is a cut that goes through all of the layers of the skin and into the tissue that is right under the skin. Some lacerations heal on their own. Others need to be closed with stitches (sutures), staples, skin adhesive strips, or skin glue. Proper laceration care minimizes the risk of infection and helps the laceration to heal better. HOW TO CARE FOR YOUR LACERATION If sutures or staples were used:  Keep the wound clean and dry.  If you were given a bandage (dressing), you should change it at least one time per day or as told by your health care provider. You should also change it if it becomes wet or dirty.  Keep the wound completely dry for the first 24 hours or as told by your health care provider. After that time, you may shower or bathe. However, make sure that the wound is not soaked in water until after the sutures or staples have been removed.  Clean the wound one time each day or as told by your health care provider:  Wash the wound with soap and water.  Rinse the wound with water to remove all soap.  Pat the wound dry with a clean towel. Do not rub the wound.  After cleaning the wound, apply a thin layer of antibiotic ointmentas told by your health care provider. This will help to prevent infection and keep the dressing from sticking to the wound.  Have the sutures or staples removed as told by your health care provider. If skin adhesive strips were used:  Keep the wound clean and dry.  If you were given a bandage (dressing), you should  change it at least one time per day or as told by your health care provider. You should also change it if it becomes dirty or wet.  Do not get the skin adhesive strips wet. You may shower or bathe, but be careful to keep the wound dry.  If the wound gets wet, pat it dry with a clean towel. Do not rub the wound.  Skin adhesive strips fall off on their own. You may trim the strips as the wound heals. Do not remove skin adhesive strips that are still stuck to the wound. They will fall off in time. If skin glue was used:  Try to keep the wound dry, but you may briefly wet it in the shower or bath. Do not soak the wound in water, such as by swimming.  After you have showered or bathed, gently pat the wound dry with a clean towel. Do not rub the wound.  Do not do any activities that will make you sweat heavily until the skin glue has fallen off on its own.  Do not apply liquid, cream, or ointment medicine to the wound while the skin glue is in place. Using those may loosen the film before the wound has healed.  If you were given a bandage (dressing), you should change it at least one time per day or as told by your health care provider. You should also change it if it becomes dirty or wet.  If a dressing is placed over the wound, be careful not to apply tape directly over the skin glue. Doing that may cause the glue to be pulled off before the wound has healed.  Do not pick at the glue. The skin glue usually remains in place for 5-10 days, then it falls off of the skin. General Instructions  Take over-the-counter and prescription medicines only as told by your health care provider.  If you were prescribed an antibiotic medicine or ointment, take or apply it as told by your doctor. Do not stop using it even if your condition improves.  To help prevent scarring, make sure to cover your wound with sunscreen whenever you are outside after stitches are removed, after adhesive strips are removed, or  when glue remains in place and the wound is healed. Make sure to wear a sunscreen of at least 30 SPF.  Do not scratch or pick at the wound.  Keep all follow-up visits as told by your health care provider. This is important.  Check your wound every day for signs of infection. Watch for:  Redness, swelling, or pain.  Fluid, blood, or pus.  Raise (elevate) the injured area above the level of your heart while you are sitting or lying down, if possible. SEEK MEDICAL CARE IF:  You received a tetanus shot and you have swelling, severe pain, redness, or bleeding at the injection site.  You have a fever.  A wound that was closed breaks open.  You notice a bad smell coming from your wound or your dressing.  You notice something coming out of the wound, such as wood or glass.  Your pain is not controlled with medicine.  You have increased redness, swelling, or pain at the site of your wound.  You have fluid, blood, or pus coming from your wound.  You notice a change in the color of your skin near your wound.  You need to change the dressing frequently due to fluid, blood, or pus draining from the wound.  You develop a new rash.  You develop numbness around the wound. SEEK IMMEDIATE MEDICAL CARE IF:  You develop severe swelling around the wound.  Your pain suddenly increases and is severe.  You develop painful lumps near the wound or on skin that is anywhere on your body.  You have a red streak going away from your wound.  The wound is on your hand or foot and you cannot properly move a finger or toe.  The wound is on your hand or foot and you notice that your fingers or toes look pale or bluish.   This information is not intended to replace advice given to you by your health care provider. Make sure you discuss any questions you have with your health care provider.   Document Released: 01/09/2005 Document Revised: 05/26/2014 Document Reviewed: 01/05/2014 Elsevier  Interactive Patient Education 2016 [place], [place], or Adhesive Wound Closure Health care providers use stitches (sutures), staples, and certain glue (skin adhesives) to hold skin together while it heals (wound closure). You may need this treatment after you have surgery or if you cut your skin accidentally. These methods help your skin to heal more quickly and make it less likely that you will have a scar. A wound may take several months to heal completely. The type of wound you have determines when your wound gets closed. In most cases, the wound is closed as soon as possible (primary skin closure). Sometimes, closure is  delayed so the wound can be cleaned and allowed to heal naturally. This reduces the chance of infection. Delayed closure may be needed if your wound:  Is caused by a bite.  Happened more than 6 hours ago.  Involves loss of skin or the tissues under the skin.  Has dirt or debris in it that cannot be removed.  Is infected. WHAT ARE THE DIFFERENT KINDS OF WOUND CLOSURES? There are many options for wound closure. The one that your health care provider uses depends on how deep and how large your wound is. Adhesive Glue To use this type of glue to close a wound, your health care provider holds the edges of the wound together and paints the glue on the surface of your skin. You may need more than one layer of glue. Then the wound may be covered with a light bandage (dressing). This type of skin closure may be used for small wounds that are not deep (superficial). Using glue for wound closure is less painful than other methods. It does not require a medicine that numbs the area (local anesthetic). This method also leaves nothing to be removed. Adhesive glue is often used for children and on facial wounds. Adhesive glue cannot be used for wounds that are deep, uneven, or bleeding. It is not used inside of a wound.  Adhesive Strips These strips are made of sticky  (adhesive), porous paper. They are applied across your skin edges like a regular adhesive bandage. You leave them on until they fall off. Adhesive strips may be used to close very superficial wounds. They may also be used along with sutures to improve the closure of your skin edges.  Sutures Sutures are the oldest method of wound closure. Sutures can be made from natural substances, such as silk, or from synthetic materials, such as nylon and steel. They can be made from a material that your body can break down as your wound heals (absorbable), or they can be made from a material that needs to be removed from your skin (nonabsorbable). They come in many different strengths and sizes. Your health care provider attaches the sutures to a steel needle on one end. Sutures can be passed through your skin, or through the tissues beneath your skin. Then they are tied and cut. Your skin edges may be closed in one continuous stitch or in separate stitches. Sutures are strong and can be used for all kinds of wounds. Absorbable sutures may be used to close tissues under the skin. The disadvantage of sutures is that they may cause skin reactions that lead to infection. Nonabsorbable sutures need to be removed. Staples When surgical staples are used to close a wound, the edges of your skin on both sides of the wound are brought close together. A staple is placed across the wound, and an instrument secures the edges together. Staples are often used to close surgical cuts (incisions). Staples are faster to use than sutures, and they cause less skin reaction. Staples need to be removed using a tool that bends the staples away from your skin. HOW DO I CARE FOR MY WOUND CLOSURE?  Take medicines only as directed by your health care provider.  If you were prescribed an antibiotic medicine for your wound, finish it all even if you start to feel better.  Use ointments or creams only as directed by your health care  provider.  Wash your hands with soap and water before and after touching your wound.  Do not  soak your wound in water. Do not take baths, swim, or use a hot tub until your health care provider approves.  Ask your health care provider when you can start showering. Cover your wound if directed by your health care provider.  Do not take out your own sutures or staples.  Do not pick at your wound. Picking can cause an infection.  Keep all follow-up visits as directed by your health care provider. This is important. HOW LONG WILL I HAVE MY WOUND CLOSURE?  Leave adhesive glue on your skin until the glue peels away.  Leave adhesive strips on your skin until the strips fall off.  Absorbable sutures will dissolve within several days.  Nonabsorbable sutures and staples must be removed. The location of the wound will determine how long they stay in. This can range from several days to a couple of weeks. WHEN SHOULD I SEEK HELP FOR MY WOUND CLOSURE? Contact your health care provider if:  You have a fever.  You have chills.  You have drainage, redness, swelling, or pain at your wound.  There is a bad smell coming from your wound.  The skin edges of your wound start to separate after your sutures have been removed.  Your wound becomes thick, raised, and darker in color after your sutures come out (scarring).   This information is not intended to replace advice given to you by your health care provider. Make sure you discuss any questions you have with your health care provider.   Document Released: 10/04/2000 Document Revised: 01/30/2014 Document Reviewed: 06/18/2013 Elsevier Interactive Patient Education [employer].

## 2015-07-25 NOTE — ED Provider Notes (Signed)
CSN: [id number]     Arrival date & time 07/25/15  1617 History   First MD Initiated Contact with Patient 07/25/15 1629     Chief Complaint  Patient presents with  . Head Laceration     (Consider location/radiation/quality/duration/timing/severity/associated sxs/prior Treatment) HPI 82 year old male who presents after mechanical fall. History of dementia, diabetes, hypertension. He had witnessed fall today. According to his wife he was moving a garbage can into the curb when he lost his balance and fell backwards. He did hit his head but did not appear to have loss of consciousness. Has a small laceration to the scalp. He was able to initially stand and ambulate. Denies headache, neck pain, back pain, chest pain, abdominal pain, or other musculoskeletal pain. Past Medical History  Diagnosis Date  . Diabetes mellitus type 2, controlled, without complications ([place]) 99991111  . Essential hypertension 01/02/2013  . Senile dementia 01/02/2013  . Second degree AV block, Mobitz type II  November 2014    Seen on office ECG; -- monitor shows intermittent second-degree block which appears to potentially be type II in some places but in other places appear to be wenkebach. Bradycardia with first-degree block and PVCs. Occasionally PVCs or block. Multiple runs of PSVT and one episode of VT. All symptoms were asymptomatic  . Anemia of chronic disease   . Osteopenia    Past Surgical History  Procedure Laterality Date  . Partial prostatectomy  1980  . Partial gastrectomy   1950s  . Inguinal hernia repair  1960 and 54    X2  . Transthoracic echocardiogram  October 2012    Proximal septal thickening. EF greater than 55%. No regional wall motion abnormalities. Mild LA dilation, mild-moderate RA dilation. Mild MAC with mild posterior leaflet prolapse and mild to moderate MR.  . Transthoracic echocardiogram  01/21/2013    Normal EF with hyperdynamic contraction EF 65-70 %. Mild MR and mild TR. No  significant wall motion abnormalities or valvular lesions.   Family History  Problem Relation Age of Onset  . Diabetes Father   . Stroke Father 13  . Cancer Mother 63    Pancreatic  . Hyperlipidemia Brother   . Dementia Sister   . Cancer Son 82    Died from colon cancer  . Asthma Son    Social History  Substance Use Topics  . Smoking status: Never Smoker   . Smokeless tobacco: None  . Alcohol Use: No    Review of Systems 10/14 systems reviewed and are negative other than those stated in the HPI    Allergies  Review of patient's allergies indicates no known allergies.  Home Medications   Prior to Admission medications   Medication Sig Start Date End Date Taking? Authorizing Provider  Calcium Carb-Cholecalciferol (CALCIUM 600 + D PO) Take by mouth daily.   Yes Historical Provider, MD  Cholecalciferol (D3-1000 PO) Take by mouth.   Yes Historical Provider, MD  donepezil (ARICEPT) 10 MG tablet Take 10 mg by mouth at bedtime.   Yes Historical Provider, MD  Folic Acid-Vit [id number] 123456 (B COMPLEX-FOLIC ACID PO) Take by mouth daily.   Yes Historical Provider, MD  Multiple Vitamins-Minerals (MULTIVITAMIN WITH MINERALS) tablet Take 1 tablet by mouth daily.   Yes Historical Provider, MD   BP 146/93 mmHg  Pulse 71  Temp(Src) 98.7 F (37.1 C) (Oral)  Resp 17  SpO2 98% Physical Exam Physical Exam  Nursing note and vitals reviewed. Constitutional: Well developed, well nourished, non-toxic, and in  no acute distress Head: Normocephalic. 1.5 cm scalp laceration to the posterior head.  Mouth/Throat: Oropharynx is clear and moist.  Neck: Normal range of motion. Neck supple.  Cardiovascular: Normal rate and regular rhythm.   Pulmonary/Chest: Effort normal and breath sounds normal.  Abdominal: Soft. There is no tenderness. There is no rebound and no guarding.  Musculoskeletal: Normal range of motion. abrasion to the right elbow. No deformities. No TLS spine tenderness. Neurological:  Alert, no facial droop, fluent speech, moves all extremities symmetrically, sensation to light touch in tact throughout Skin: Skin is warm and dry.  Psychiatric: Cooperative  ED Course  .Marland KitchenLaceration Repair Date/Time: 07/25/2015 6:02 PM Performed by: Brantley Stage DUO Authorized by: Brantley Stage DUO Consent: Verbal consent obtained. Risks and benefits: risks, benefits and alternatives were discussed Consent given by: patient Patient identity confirmed: verbally with patient Time out: Immediately prior to procedure a "time out" was called to verify the correct patient, procedure, equipment, support staff and site/side marked as required. Body area: head/neck Location details: scalp Laceration length: 1 cm Foreign bodies: no foreign bodies Tendon involvement: none Nerve involvement: none Vascular damage: no Local anesthetic: topical anesthetic and LET (lido,epi,tetracaine) Patient sedated: no Irrigation solution: saline Debridement: none Degree of undermining: none Skin closure: staples Number of sutures: 1 Technique: simple Approximation: close Approximation difficulty: simple Dressing: antibiotic ointment Patient tolerance: Patient tolerated the procedure well with no immediate complications   (including critical care time) Labs Review Labs Reviewed - No data to display  Imaging Review Ct Head Wo Contrast  07/25/2015  CLINICAL DATA:  The patient fell and sustained head trauma. EXAM: CT HEAD WITHOUT CONTRAST CT CERVICAL SPINE WITHOUT CONTRAST TECHNIQUE: Multidetector CT imaging of the head and cervical spine was performed following the standard protocol without intravenous contrast. Multiplanar CT image reconstructions of the cervical spine were also generated. COMPARISON:  None. FINDINGS: CT HEAD FINDINGS No mass lesion. No midline shift. No acute hemorrhage or hematoma. No extra-axial fluid collections. No evidence of acute infarction. There is diffuse slight cerebral cortical atrophy  with secondary ventricular dilatation. The atrophy is most severe in the temporal lobes. No acute bone abnormality. Slight soft tissue swelling over the right posterior parietal region of the scalp. CT CERVICAL SPINE FINDINGS There is no fracture or prevertebral soft tissue swelling. There is slight degenerative disc disease at C3-4 with moderately severe degenerative disc disease at C6-7. Slight anterolisthesis of C5 on C6. This is felt to be chronic. Left set joint is fused at C5-6. The left facet joint is also fused at T1-2. Chronic anterior wedge deformity of T1-2. Moderate degenerative changes between the anterior arch of C1 and the odontoid process of C2. IMPRESSION: 1. Slight scalp swelling over the right parietal region. Chronic atrophy. 2. No acute abnormality of the cervical spine. Electronically Signed   By: Lorriane Shire M.D.   On: 07/25/2015 17:43   Ct Cervical Spine Wo Contrast  07/25/2015  CLINICAL DATA:  The patient fell and sustained head trauma. EXAM: CT HEAD WITHOUT CONTRAST CT CERVICAL SPINE WITHOUT CONTRAST TECHNIQUE: Multidetector CT imaging of the head and cervical spine was performed following the standard protocol without intravenous contrast. Multiplanar CT image reconstructions of the cervical spine were also generated. COMPARISON:  None. FINDINGS: CT HEAD FINDINGS No mass lesion. No midline shift. No acute hemorrhage or hematoma. No extra-axial fluid collections. No evidence of acute infarction. There is diffuse slight cerebral cortical atrophy with secondary ventricular dilatation. The atrophy is most severe in the temporal  lobes. No acute bone abnormality. Slight soft tissue swelling over the right posterior parietal region of the scalp. CT CERVICAL SPINE FINDINGS There is no fracture or prevertebral soft tissue swelling. There is slight degenerative disc disease at C3-4 with moderately severe degenerative disc disease at C6-7. Slight anterolisthesis of C5 on C6. This is felt to be  chronic. Left set joint is fused at C5-6. The left facet joint is also fused at T1-2. Chronic anterior wedge deformity of T1-2. Moderate degenerative changes between the anterior arch of C1 and the odontoid process of C2. IMPRESSION: 1. Slight scalp swelling over the right parietal region. Chronic atrophy. 2. No acute abnormality of the cervical spine. Electronically Signed   By: Lorriane Shire M.D.   On: 07/25/2015 17:43   I have personally reviewed and evaluated these images and lab results as part of my medical decision-making.   EKG Interpretation None      MDM   Final diagnoses:  Scalp laceration, initial encounter  Head injury, initial encounter    82 year old male, not on blood thinners who presents after mechanical fall. Baseline mental status. Small scalp laceration. Neuro intact, and remainder of exam is not revealing any major traumatic injuries. CT head and cervical spine are negative and does not show traumatic head or neck injury. Laceration repaired with staples. Discussed wound care for home. Strict return and follow-up instructions are reviewed and documented for him and family.    Forde Dandy, MD 07/25/15 [phone]

## 2015-08-03 DIAGNOSIS — R296 Repeated falls: Secondary | ICD-10-CM | POA: Diagnosis not present

## 2015-08-03 DIAGNOSIS — S0101XA Laceration without foreign body of scalp, initial encounter: Secondary | ICD-10-CM | POA: Diagnosis not present

## 2015-09-20 ENCOUNTER — Other Ambulatory Visit: Payer: Self-pay

## 2015-10-14 ENCOUNTER — Ambulatory Visit (INDEPENDENT_AMBULATORY_CARE_PROVIDER_SITE_OTHER): Payer: Medicare Other | Admitting: Podiatry

## 2015-10-14 ENCOUNTER — Encounter: Payer: Self-pay | Admitting: Podiatry

## 2015-10-14 VITALS — BP 191/96 | HR 58 | Resp 14

## 2015-10-14 DIAGNOSIS — B351 Tinea unguium: Secondary | ICD-10-CM

## 2015-10-14 DIAGNOSIS — M79676 Pain in unspecified toe(s): Secondary | ICD-10-CM

## 2015-10-14 NOTE — Progress Notes (Signed)
Complaint:  Visit Type: Patient returns to my office for continued preventative foot care services. Complaint: Patient states" my nails have grown long and thick and become painful to walk and wear shoes" Patient has been diagnosed with DM with no foot complications. The patient presents for preventative foot care services. No changes to ROS  Podiatric Exam: Vascular: dorsalis pedis and posterior tibial pulses are not  palpable bilateral. Capillary return is immediate. Temperature gradient is WNL. Skin turgor WNL  Sensorium: Normal Semmes Weinstein monofilament test. Normal tactile sensation bilaterally. Nail Exam: Pt has thick disfigured discolored nails with subungual debris noted bilateral entire nail hallux through fifth toenails Ulcer Exam: There is no evidence of ulcer or pre-ulcerative changes or infection. Orthopedic Exam: Muscle tone and strength are WNL. No limitations in general ROM. No crepitus or effusions noted. Foot type and digits show no abnormalities. Bony prominences are unremarkable. Skin: No Porokeratosis. No infection or ulcers  Diagnosis:  Onychomycosis, , Pain in right toe, pain in left toes  Treatment & Plan Procedures and Treatment: Consent by patient was obtained for treatment procedures. The patient understood the discussion of treatment and procedures well. All questions were answered thoroughly reviewed. Debridement of mycotic and hypertrophic toenails, 1 through 5 bilateral and clearing of subungual debris. No ulceration, no infection noted.  Return Visit-Office Procedure: Patient instructed to return to the office for a follow up visit 3 months for continued evaluation and treatment.    Gardiner Barefoot DPM

## 2015-11-17 DIAGNOSIS — Z23 Encounter for immunization: Secondary | ICD-10-CM | POA: Diagnosis not present

## 2016-01-06 ENCOUNTER — Ambulatory Visit: Payer: Medicare Other | Admitting: Podiatry

## 2016-01-20 ENCOUNTER — Ambulatory Visit: Payer: Medicare Other | Admitting: Podiatry

## 2016-03-22 ENCOUNTER — Emergency Department (HOSPITAL_COMMUNITY)
Admission: EM | Admit: 2016-03-22 | Discharge: 2016-03-22 | Disposition: A | Discharge status: Home / Self Care | Payer: Medicare HMO | Attending: Emergency Medicine | Admitting: Emergency Medicine

## 2016-03-22 ENCOUNTER — Emergency Department (HOSPITAL_COMMUNITY): Payer: Medicare HMO

## 2016-03-22 DIAGNOSIS — T148XXA Other injury of unspecified body region, initial encounter: Secondary | ICD-10-CM | POA: Diagnosis not present

## 2016-03-22 DIAGNOSIS — M533 Sacrococcygeal disorders, not elsewhere classified: Secondary | ICD-10-CM | POA: Diagnosis not present

## 2016-03-22 DIAGNOSIS — I10 Essential (primary) hypertension: Secondary | ICD-10-CM | POA: Diagnosis not present

## 2016-03-22 DIAGNOSIS — Y939 Activity, unspecified: Secondary | ICD-10-CM | POA: Diagnosis not present

## 2016-03-22 DIAGNOSIS — M25551 Pain in right hip: Secondary | ICD-10-CM | POA: Diagnosis not present

## 2016-03-22 DIAGNOSIS — Y999 Unspecified external cause status: Secondary | ICD-10-CM | POA: Insufficient documentation

## 2016-03-22 DIAGNOSIS — F0390 Unspecified dementia without behavioral disturbance: Secondary | ICD-10-CM | POA: Insufficient documentation

## 2016-03-22 DIAGNOSIS — Y929 Unspecified place or not applicable: Secondary | ICD-10-CM | POA: Insufficient documentation

## 2016-03-22 DIAGNOSIS — W19XXXA Unspecified fall, initial encounter: Secondary | ICD-10-CM

## 2016-03-22 DIAGNOSIS — M5489 Other dorsalgia: Secondary | ICD-10-CM | POA: Diagnosis not present

## 2016-03-22 DIAGNOSIS — W1830XA Fall on same level, unspecified, initial encounter: Secondary | ICD-10-CM | POA: Insufficient documentation

## 2016-03-22 DIAGNOSIS — R69 Illness, unspecified: Secondary | ICD-10-CM | POA: Diagnosis not present

## 2016-03-22 DIAGNOSIS — Z79899 Other long term (current) drug therapy: Secondary | ICD-10-CM | POA: Diagnosis not present

## 2016-03-22 DIAGNOSIS — S199XXA Unspecified injury of neck, initial encounter: Secondary | ICD-10-CM | POA: Diagnosis not present

## 2016-03-22 DIAGNOSIS — S0990XA Unspecified injury of head, initial encounter: Secondary | ICD-10-CM | POA: Diagnosis not present

## 2016-03-22 DIAGNOSIS — E119 Type 2 diabetes mellitus without complications: Secondary | ICD-10-CM | POA: Diagnosis not present

## 2016-03-22 DIAGNOSIS — S3992XA Unspecified injury of lower back, initial encounter: Secondary | ICD-10-CM | POA: Diagnosis not present

## 2016-03-22 DIAGNOSIS — M25552 Pain in left hip: Secondary | ICD-10-CM | POA: Diagnosis not present

## 2016-03-22 DIAGNOSIS — M545 Low back pain: Secondary | ICD-10-CM | POA: Diagnosis not present

## 2016-03-22 MED ORDER — TRAMADOL HCL 50 MG PO TABS: 50.0000 mg | ORAL_TABLET | Freq: Four times a day (QID) | ORAL | 0 refills | Status: AC | PRN

## 2016-03-22 NOTE — ED Provider Notes (Signed)
[place] DEPT Provider Note   CSN: [id number] Arrival date & time: 03/22/16  1634     History   Chief Complaint No chief complaint on file.   HPI Christian Daniels is a 83 y.o. male.  Patient with a history of dementia which is currently baseline. Patient's had several falls. Had a fall yesterday EMS was out to the house and patient was not brought in for evaluation. Patient now here with neighbor. Patient complaining extensively of tailbone pain. Patient normally ambulates with a walker. Patient could've hit his head with one of the falls.      Past Medical History:  Diagnosis Date  . Anemia of chronic disease   . Diabetes mellitus type 2, controlled, without complications ([place]) 99/24/2683  . Essential hypertension 01/02/2013  . Osteopenia   . Second degree AV block, Mobitz type II  November 2014   Seen on office ECG; -- monitor shows intermittent second-degree block which appears to potentially be type II in some places but in other places appear to be wenkebach. Bradycardia with first-degree block and PVCs. Occasionally PVCs or block. Multiple runs of PSVT and one episode of VT. All symptoms were asymptomatic  . Senile dementia 01/02/2013    Patient Active Problem List   Diagnosis Date Noted  . PSVT (paroxysmal supraventricular tachycardia) - short bursts that are asymptomatic 02/05/2013  . Murmur 01/02/2013  . Second degree atrioventricular block 01/02/2013  . Essential hypertension 01/02/2013  . Diabetes mellitus type 2, controlled, without complications ([place]) 41/96/2229  . Senile dementia 01/02/2013    Past Surgical History:  Procedure Laterality Date  . [place] and 1   X2  . PARTIAL GASTRECTOMY   1950s  . Partial prostatectomy  1980  . TRANSTHORACIC ECHOCARDIOGRAM  October 2012   Proximal septal thickening. EF greater than 55%. No regional wall motion abnormalities. Mild LA dilation, mild-moderate RA dilation. Mild MAC with mild  posterior leaflet prolapse and mild to moderate MR.  . TRANSTHORACIC ECHOCARDIOGRAM  01/21/2013   Normal EF with hyperdynamic contraction EF 65-70 %. Mild MR and mild TR. No significant wall motion abnormalities or valvular lesions.       Home Medications    Prior to Admission medications   Medication Sig Start Date End Date Taking? Authorizing Provider  diphenhydramine-acetaminophen (TYLENOL PM) 25-500 MG TABS tablet Take 2 tablets by mouth at bedtime.   Yes Historical Provider, MD  donepezil (ARICEPT) 10 MG tablet Take 10 mg by mouth at bedtime.   Yes Historical Provider, MD  traMADol (ULTRAM) 50 MG tablet Take 1 tablet (50 mg total) by mouth every 6 (six) hours as needed. 03/22/16   Fredia Sorrow, MD    Family History Family History  Problem Relation Age of Onset  . Diabetes Father   . Stroke Father 62  . Cancer Mother 66    Pancreatic  . Hyperlipidemia Brother   . Dementia Sister   . Cancer Son 94    Died from colon cancer  . Asthma Son     Social History Social History  Substance Use Topics  . Smoking status: Never Smoker  . Smokeless tobacco: Not on file  . Alcohol use No     Allergies   Patient has no known allergies.   Review of Systems Review of Systems  Unable to perform ROS: Dementia     Physical Exam Updated Vital Signs BP 162/70   Pulse 72   Temp 99 F (37.2 C) (Oral)  Resp 18   SpO2 97%   Physical Exam  Constitutional: He appears well-developed and well-nourished. No distress.  HENT:  Head: Normocephalic and atraumatic.  Eyes: EOM are normal. Pupils are equal, round, and reactive to light.  Neck: Normal range of motion. Neck supple.  Cardiovascular: Normal rate, regular rhythm and normal heart sounds.   Pulmonary/Chest: Effort normal and breath sounds normal.  Abdominal: Soft. Bowel sounds are normal.  Musculoskeletal: Normal range of motion. He exhibits no tenderness.  Neurological: He is alert.  Baseline dementia.  Skin: Skin is  warm.  Nursing note and vitals reviewed.    ED Treatments / Results  Labs (all labs ordered are listed, but only abnormal results are displayed) Labs Reviewed - No data to display  EKG  EKG Interpretation None       Radiology Dg Lumbar Spine Complete  Result Date: 03/22/2016 CLINICAL DATA:  Sacral pain after fall.  Dementia. EXAM: LUMBAR SPINE - COMPLETE 4+ VIEW COMPARISON:  None. FINDINGS: Age-indeterminate mild superior endplate compressions of L1, L3 and L5 without retropulsion. No sacral fracture is apparent. No spondylolysis nor spondylolisthesis. Disc space narrowing at L1-2. Lower lumbar facet arthropathy is identified from L3 through S1. IMPRESSION: Age-indeterminate mild superior endplate compressions of L1, L3 and L5 most marked at L1. Degenerative disc disease at L1-L2. Lower lumbar facet arthropathy. Electronically Signed   By: Ashley Royalty M.D.   On: 03/22/2016 18:38   Ct Head Wo Contrast  Result Date: 03/22/2016 CLINICAL DATA:  83 year old male with multiple falls, most recently yesterday. Sacral pain. Dementia. Initial encounter. EXAM: CT HEAD WITHOUT CONTRAST CT CERVICAL SPINE WITHOUT CONTRAST TECHNIQUE: Multidetector CT imaging of the head and cervical spine was performed following the standard protocol without intravenous contrast. Multiplanar CT image reconstructions of the cervical spine were also generated. COMPARISON:  CT head and cervical spine 07/25/2015. FINDINGS: CT HEAD FINDINGS Brain: Stable cerebral volume. No midline shift, ventriculomegaly, mass effect, evidence of mass lesion, intracranial hemorrhage or evidence of cortically based acute infarction. Gray-white matter differentiation is within normal limits throughout the brain. No cortical encephalomalacia identified. Vascular: Calcified atherosclerosis at the skull base. No suspicious intracranial vascular hyperdensity. Skull: Osteopenia. Calvarium and skullbase appears stable and intact. Sinuses/Orbits:  Visualized paranasal sinuses and mastoids are stable and well pneumatized. Other: Stable and negative orbits soft tissues. No scalp hematoma identified. Scalp vessel calcified atherosclerosis re- demonstrated. CT CERVICAL SPINE FINDINGS Alignment: Stable mildly exaggerated cervical lordosis. Stable mild anterolisthesis of C5 on C6 and C7 on T1. Chronic left C5-C6 and bilateral T1-T2 posterior element ankylosis. Bilateral posterior element alignment is within normal limits. Skull base and vertebrae: Osteopenia. Visualized skull base is intact. No atlanto-occipital dissociation. No cervical fracture identified. Soft tissues and spinal canal: No prevertebral fluid or swelling. No visible canal hematoma. Negative noncontrast neck soft tissues. Disc levels: Chronic disc space loss and endplate spurring at Z6-O2. No significant cervical spinal stenosis suspected. Intermittent cervical facet hypertrophy, maximal on the left at C5-C6 Upper chest: Visible upper thoracic levels appear stable, including mild anterior wedging of T2. Negative lung apices. IMPRESSION: 1. Stable non contrast CT appearance of the brain, no acute intracranial abnormality. 2. Stable cervical spine, with no acute fracture or listhesis identified. Electronically Signed   By: Genevie Ann M.D.   On: 03/22/2016 18:50   Ct Cervical Spine Wo Contrast  Result Date: 03/22/2016 CLINICAL DATA:  83 year old male with multiple falls, most recently yesterday. Sacral pain. Dementia. Initial encounter. EXAM: CT HEAD WITHOUT CONTRAST CT  CERVICAL SPINE WITHOUT CONTRAST TECHNIQUE: Multidetector CT imaging of the head and cervical spine was performed following the standard protocol without intravenous contrast. Multiplanar CT image reconstructions of the cervical spine were also generated. COMPARISON:  CT head and cervical spine 07/25/2015. FINDINGS: CT HEAD FINDINGS Brain: Stable cerebral volume. No midline shift, ventriculomegaly, mass effect, evidence of mass lesion,  intracranial hemorrhage or evidence of cortically based acute infarction. Gray-white matter differentiation is within normal limits throughout the brain. No cortical encephalomalacia identified. Vascular: Calcified atherosclerosis at the skull base. No suspicious intracranial vascular hyperdensity. Skull: Osteopenia. Calvarium and skullbase appears stable and intact. Sinuses/Orbits: Visualized paranasal sinuses and mastoids are stable and well pneumatized. Other: Stable and negative orbits soft tissues. No scalp hematoma identified. Scalp vessel calcified atherosclerosis re- demonstrated. CT CERVICAL SPINE FINDINGS Alignment: Stable mildly exaggerated cervical lordosis. Stable mild anterolisthesis of C5 on C6 and C7 on T1. Chronic left C5-C6 and bilateral T1-T2 posterior element ankylosis. Bilateral posterior element alignment is within normal limits. Skull base and vertebrae: Osteopenia. Visualized skull base is intact. No atlanto-occipital dissociation. No cervical fracture identified. Soft tissues and spinal canal: No prevertebral fluid or swelling. No visible canal hematoma. Negative noncontrast neck soft tissues. Disc levels: Chronic disc space loss and endplate spurring at L8-X2. No significant cervical spinal stenosis suspected. Intermittent cervical facet hypertrophy, maximal on the left at C5-C6 Upper chest: Visible upper thoracic levels appear stable, including mild anterior wedging of T2. Negative lung apices. IMPRESSION: 1. Stable non contrast CT appearance of the brain, no acute intracranial abnormality. 2. Stable cervical spine, with no acute fracture or listhesis identified. Electronically Signed   By: Genevie Ann M.D.   On: 03/22/2016 18:50   Dg Hips Bilat W Or Wo Pelvis 3-4 Views  Result Date: 03/22/2016 CLINICAL DATA:  Bilateral hip pain.  Multiple falls. EXAM: DG HIP (WITH OR WITHOUT PELVIS) 3-4V BILAT COMPARISON:  None. FINDINGS: The cortical margins of the bony pelvis are intact. No fracture.  Pubic symphysis and sacroiliac joints are congruent. Both femoral heads are well-seated in the respective acetabula. Mild age related degeneration of both acetabula. Surgical clips in the midline pelvis. IMPRESSION: No pelvic or hip fracture. Electronically Signed   By: Jeb Levering M.D.   On: 03/22/2016 18:36    Procedures Procedures (including critical care time)  Medications Ordered in ED Medications - No data to display   Initial Impression / Assessment and Plan / ED Course  I have reviewed the triage vital signs and the nursing notes.  Pertinent labs & imaging results that were available during my care of the patient were reviewed by me and considered in my medical decision making (see chart for details).     The patient's workup for the fall yesterday and previous fall CT head neck negative x-rays of pelvis and bilateral hips negative. Lumbar x-ray showed no tailbone injury however does raise age indeterminate L1 L3 L5 small endplate injuries.  Nothing that's unstable. Will treat symptomatically. Suspect the injuries are old.  Final Clinical Impressions(s) / ED Diagnoses   Final diagnoses:  Fall, initial encounter  Tail bone pain    New Prescriptions New Prescriptions   TRAMADOL (ULTRAM) 50 MG TABLET    Take 1 tablet (50 mg total) by mouth every 6 (six) hours as needed.     Fredia Sorrow, MD 03/22/16 [phone]

## 2016-03-22 NOTE — ED Triage Notes (Signed)
Per EMS, pt is coming from home with complaints of pain in the sacral area. Wife called EMS yesterday after pt experienced fall and EMS came out and the decision was made not to transport pt. Pt has a hx of advanced dementia and is currently at baseline. EMS reports being told that pt ambulates independently.

## 2016-03-22 NOTE — Discharge Instructions (Signed)
No significant findings CT head neck x-rays of pelvis and hips without any bony injuries. X-rays of the lower lumbar part of the back not including the tailbone do show some evidence of old injuries at the L1 L3 L5 level. Even if these were acute treatment still would be pain control. No evidence of anything requiring follow-up at this time.

## 2016-04-13 ENCOUNTER — Ambulatory Visit: Payer: Medicare Other | Admitting: Podiatry

## 2016-05-04 ENCOUNTER — Ambulatory Visit: Payer: Medicare HMO | Admitting: Podiatry

## 2016-06-23 DEATH — deceased

## 2017-05-09 IMAGING — CT CT CERVICAL SPINE W/O CM
4 of 6 series · 14 of 33 positions shown, 15 images · non-contrast
Comparison: CT head and cervical spine 07/25/2015.

CLINICAL DATA: [AGE] male with multiple falls, most recently
yesterday. Sacral pain. Dementia. Initial encounter.

EXAM:
CT HEAD WITHOUT CONTRAST
CT CERVICAL SPINE WITHOUT CONTRAST
TECHNIQUE: Multidetector CT imaging of the head and cervical spine was
performed following the standard protocol without intravenous
contrast. Multiplanar CT image reconstructions of the cervical spine
were also generated.

[Series 4: bone windows · axial · 0.45mm/px · z∈[+1598,+1694]mm · 4 of 80 slices shown, 5 images]
[im 16/80  soft-tissue]
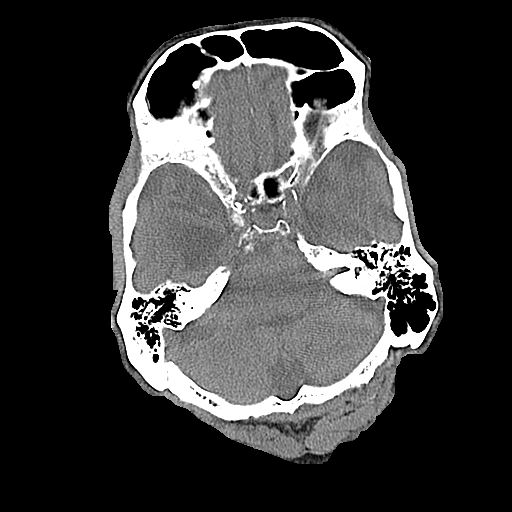
[im 16/80  bone]
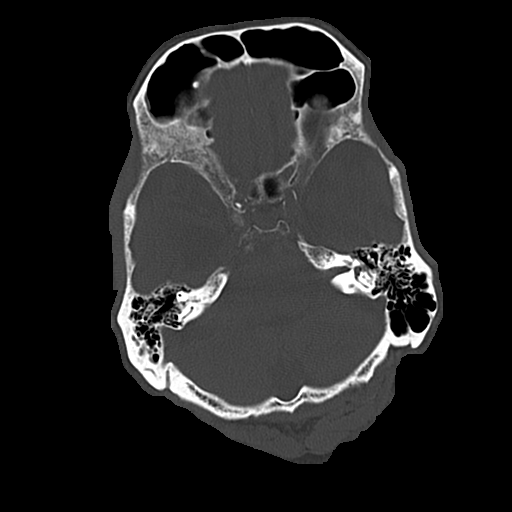
[im 32/80  bone]
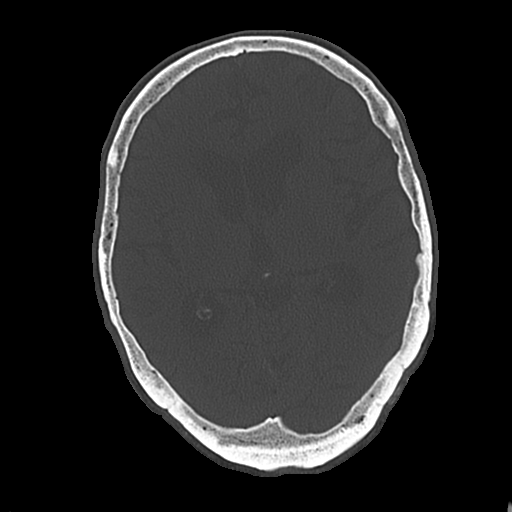
[im 48/80  bone]
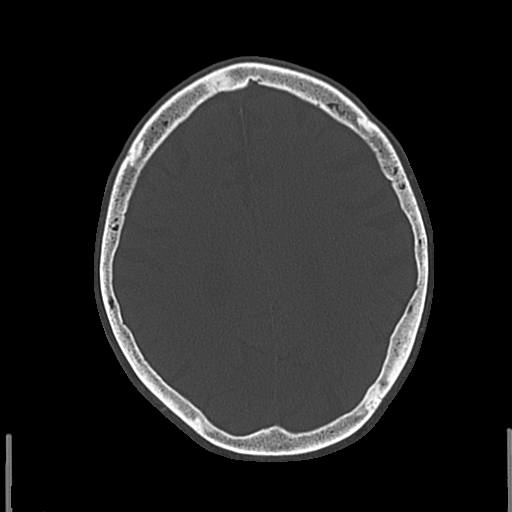
[im 64/80  bone]
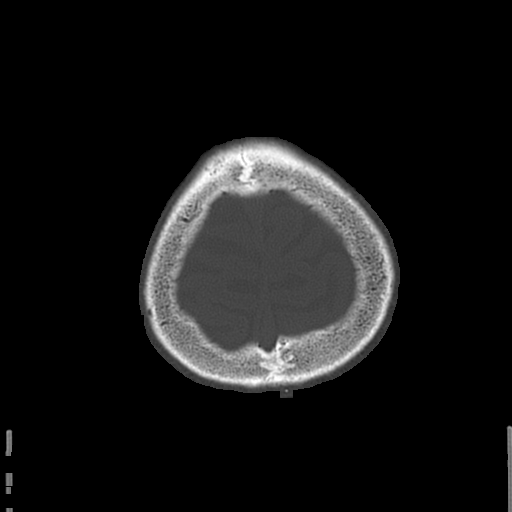

[Series 6: coronal · coronal · 0.31mm/px · 3 of 77 slices shown]
[im 16/77  bone]
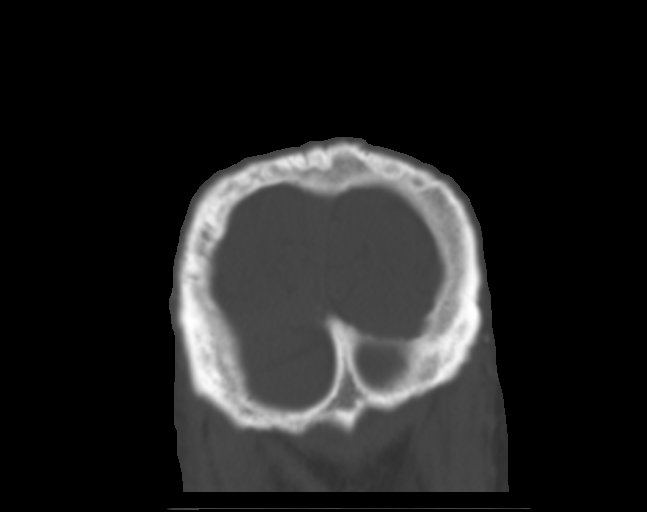
[im 31/77  bone]
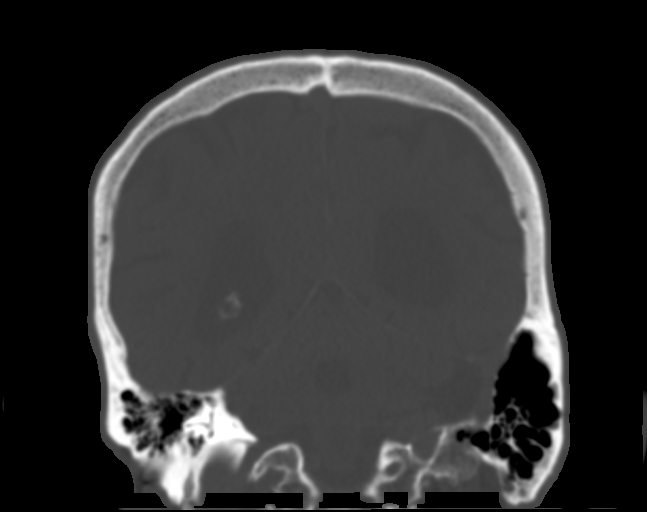
[im 46/77  bone]
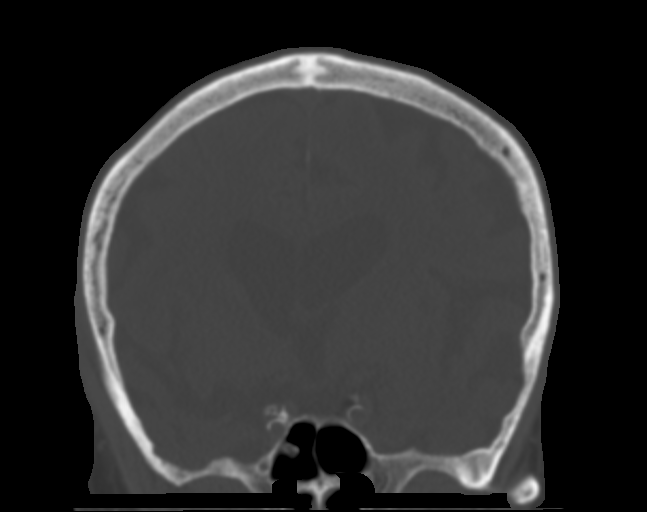

[Series 7: sagittal · sagittal · 0.31mm/px · 5 of 74 slices shown]
[im 13/74  bone]
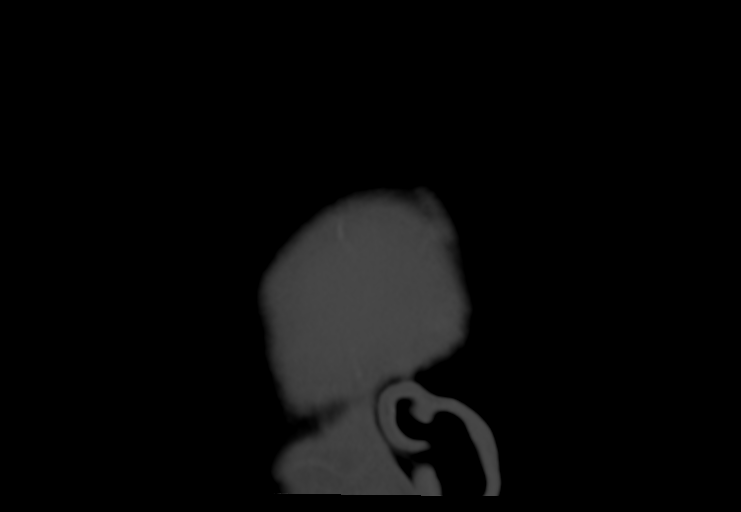
[im 25/74  bone]
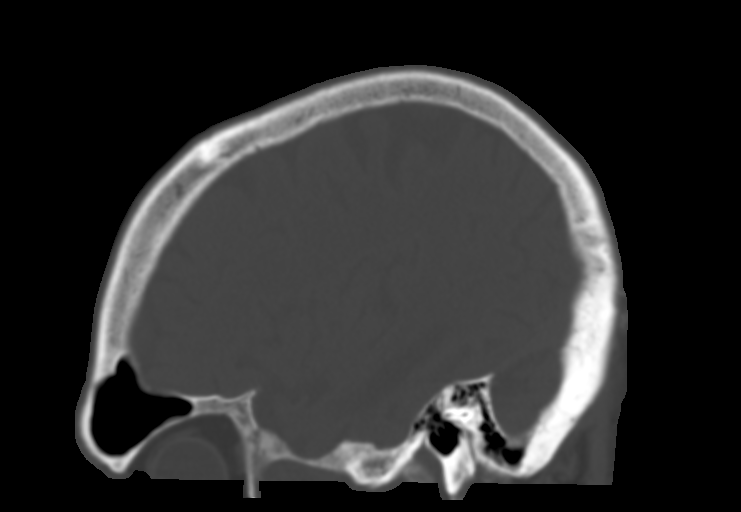
[im 37/74  bone]
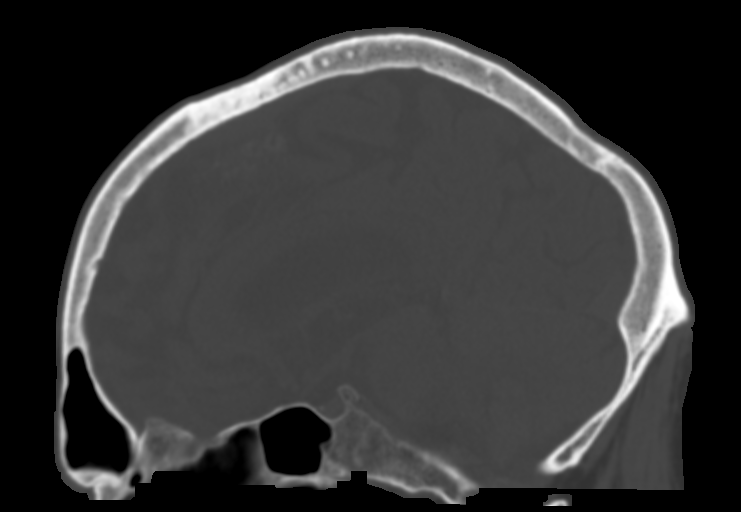
[im 49/74  bone]
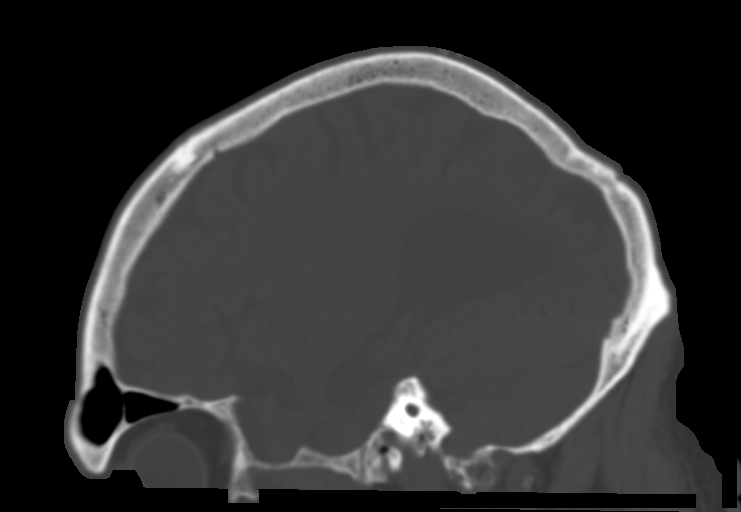
[im 61/74  bone]
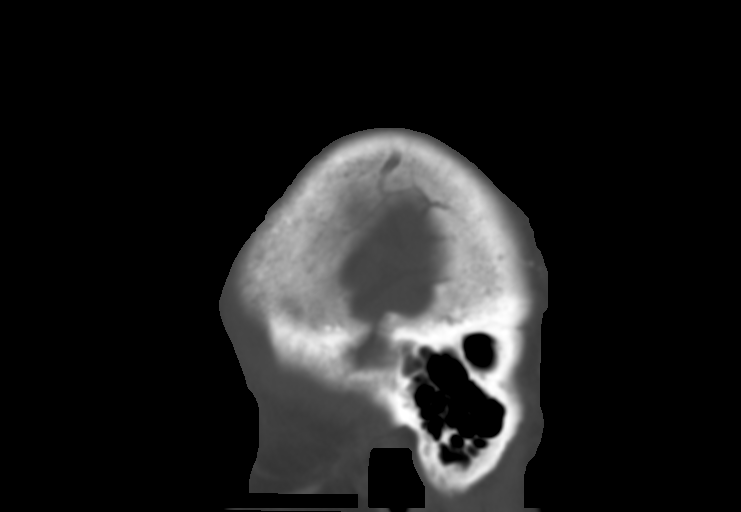

[Series 8: c-spine st · axial · 0.46mm/px · z∈[+1476,+1506]mm · 2 of 75 slices shown]
[im 15/75  bone]
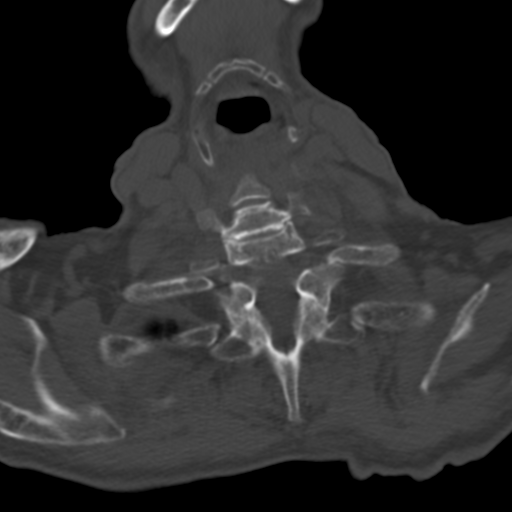
[im 30/75  bone]
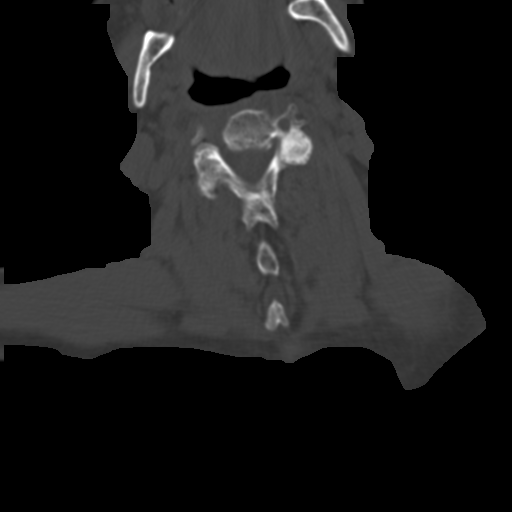

[14 of 33 positions shown; findings below may reference images not displayed]

FINDINGS: CT HEAD FINDINGS

Brain: Stable cerebral volume. No midline shift, ventriculomegaly,
mass effect, evidence of mass lesion, intracranial hemorrhage or
evidence of cortically based acute infarction. Gray-white matter
differentiation is within normal limits throughout the brain. No
cortical encephalomalacia identified.

Vascular: Calcified atherosclerosis at the skull base. No suspicious
intracranial vascular hyperdensity.

Skull: Osteopenia. Calvarium and skullbase appears stable and
intact.

Sinuses/Orbits: Visualized paranasal sinuses and mastoids are stable
and well pneumatized.

Other: Stable and negative orbits soft tissues. No scalp hematoma
identified. Scalp vessel calcified atherosclerosis re- demonstrated.

CT CERVICAL SPINE FINDINGS

Alignment: Stable mildly exaggerated cervical lordosis. Stable mild
anterolisthesis of C5 on C6 and C7 on T1. Chronic left C5-C6 and
bilateral T1-T2 posterior element ankylosis. Bilateral posterior
element alignment is within normal limits.

Skull base and vertebrae: Osteopenia. Visualized skull base is
intact. No atlanto-occipital dissociation. No cervical fracture
identified.

Soft tissues and spinal canal: No prevertebral fluid or swelling. No
visible canal hematoma.

Negative noncontrast neck soft tissues.

Disc levels: Chronic disc space loss and endplate spurring at C6-C7.
No significant cervical spinal stenosis suspected.

Intermittent cervical facet hypertrophy, maximal on the left at
C5-C6

Upper chest: Visible upper thoracic levels appear stable, including
mild anterior wedging of T2. Negative lung apices.
IMPRESSION: 1. Stable non contrast CT appearance of the brain, no acute
intracranial abnormality.
2. Stable cervical spine, with no acute fracture or listhesis
identified.
